# Patient Record
Sex: Male | Born: 1995 | Race: Black or African American | Hispanic: No | Marital: Single | Smoking: Never smoker
Health system: Southern US, Community
[De-identification: ages and names within clinical notes are randomized; demographics above are authoritative.]

## PROBLEM LIST (undated history)

## (undated) DIAGNOSIS — I456 Pre-excitation syndrome: Secondary | ICD-10-CM

---

## 2017-03-03 ENCOUNTER — Emergency Department (HOSPITAL_COMMUNITY)
Admission: EM | Admit: 2017-03-03 | Discharge: 2017-03-03 | Disposition: A | Discharge status: Home / Self Care | Payer: Managed Care, Other (non HMO) | Attending: Emergency Medicine | Admitting: Emergency Medicine

## 2017-03-03 ENCOUNTER — Encounter (HOSPITAL_COMMUNITY): Payer: Self-pay | Admitting: Emergency Medicine

## 2017-03-03 ENCOUNTER — Emergency Department (HOSPITAL_COMMUNITY): Payer: Managed Care, Other (non HMO)

## 2017-03-03 DIAGNOSIS — Z79899 Other long term (current) drug therapy: Secondary | ICD-10-CM | POA: Insufficient documentation

## 2017-03-03 DIAGNOSIS — R0789 Other chest pain: Secondary | ICD-10-CM | POA: Diagnosis not present

## 2017-03-03 DIAGNOSIS — R112 Nausea with vomiting, unspecified: Secondary | ICD-10-CM | POA: Diagnosis not present

## 2017-03-03 DIAGNOSIS — R002 Palpitations: Secondary | ICD-10-CM | POA: Insufficient documentation

## 2017-03-03 LAB — BASIC METABOLIC PANEL
Anion gap: 8 (ref 5–15)
BUN: 11 mg/dL (ref 6–20)
CO2: 25 mmol/L (ref 22–32)
CREATININE: 1.15 mg/dL (ref 0.61–1.24)
Calcium: 9.4 mg/dL (ref 8.9–10.3)
Chloride: 104 mmol/L (ref 101–111)
GFR calc Af Amer: >60 mL/min (ref >60)
GLUCOSE: 94 mg/dL (ref 65–99)
POTASSIUM: 4.4 mmol/L (ref 3.5–5.1)
Sodium: 137 mmol/L (ref 135–145)

## 2017-03-03 LAB — CBC
HEMATOCRIT: 43.9 % (ref 39.0–52.0)
Hemoglobin: 14.8 g/dL (ref 13.0–17.0)
MCH: 29.4 pg (ref 26.0–34.0)
MCHC: 33.7 g/dL (ref 30.0–36.0)
MCV: 87.3 fL (ref 78.0–100.0)
Platelets: 199 10*3/uL (ref 150–400)
RBC: 5.03 MIL/uL (ref 4.22–5.81)
RDW: 11.8 % (ref 11.5–15.5)
WBC: 4.9 10*3/uL (ref 4.0–10.5)

## 2017-03-03 LAB — I-STAT TROPONIN, ED: Troponin i, poc: 0 ng/mL (ref 0.00–0.08)

## 2017-03-03 MED ORDER — ACETAMINOPHEN 500 MG PO TABS
1000.0000 mg | ORAL_TABLET | Freq: Once | ORAL | Status: AC
  Administered 2017-03-03: 1000 mg via ORAL
  Filled 2017-03-03: qty 2

## 2017-03-03 MED ORDER — ONDANSETRON 4 MG PO TBDP
4.0000 mg | ORAL_TABLET | Freq: Once | ORAL | Status: AC
  Administered 2017-03-03: 4 mg via ORAL
  Filled 2017-03-03: qty 1

## 2017-03-03 NOTE — ED Provider Notes (Signed)
[facility]   CSN: [id number] Arrival date & time: 03/03/17  1026     History   Chief Complaint Chief Complaint  Patient presents with  . Chest Pain    HPI William Ortiz is a 21 y.o. male with past medical history of Parkinson White, presenting to the ED with an episode of palpitations this morning at 0600 that lasted about 5 minutes, with associated left-sided sharp chest pain.  His pains are followed by episode of nausea and vomiting.  He still feels some minor chest discomfort and feels tired.  Patient states he has had episodes in the past due to his Wolff-Parkinson-White, they consist of about a minute of palpitations that feel like tachycardia, which then resolve with vagal maneuvers.  This episode is different in the fact that it lasted a few minutes, and he has not had the chest pains before.  Patient followed by pediatric cardiology of [facility].  Current patient management of Wolff-Parkinson-White.  States he gets biyearly checkups, however does not have an cardiologist yet.   The history is provided by the patient.    History reviewed. No pertinent past medical history.  There are no active problems to display for this patient.   History reviewed. No pertinent surgical history.     Home Medications    Prior to Admission medications   Medication Sig Start Date End Date Taking? Authorizing Provider  escitalopram (LEXAPRO) 10 MG tablet Take 10 mg by mouth daily as needed. 12/02/15  Yes [provider]  Polyethyl Glycol-Propyl Glycol (SYSTANE OP) Apply 2 drops to eye as needed (dry eyes).   Yes [provider]    Family History No family history on file.  Social History Social History   Tobacco Use  . Smoking status: Never Smoker  . Smokeless tobacco: Never Used  Substance Use Topics  . Alcohol use: Yes  . Drug use: No     Allergies   Patient has no known allergies.   Review  of Systems Review of Systems  Constitutional: Positive for fatigue. Negative for fever.  Respiratory: Negative for shortness of breath.   Cardiovascular: Positive for chest pain and palpitations. Negative for leg swelling.  Gastrointestinal: Positive for nausea and vomiting.  Neurological: Negative for syncope.  All other systems reviewed and are negative.    Physical Exam Updated Vital Signs BP 127/80 (BP Location: Right Arm)   Pulse 72   Temp 97.9 F (36.6 C) (Oral)   Resp 15   Ht 6' (1.829 m)   Wt 108.9 kg (240 lb)   SpO2 99%   BMI 32.55 kg/m   Physical Exam  Constitutional: He appears well-developed and well-nourished. He does not appear ill. No distress.  HENT:  Head: Normocephalic and atraumatic.  Mouth/Throat: Oropharynx is clear and moist.  Eyes: Conjunctivae are normal.  Neck: Normal range of motion. Neck supple.  Cardiovascular: Normal rate, regular rhythm, normal heart sounds, intact distal pulses and normal pulses. Exam reveals no gallop and no friction rub.  No murmur heard. Pulmonary/Chest: Effort normal and breath sounds normal. No stridor. No respiratory distress. He has no wheezes. He has no rales. He exhibits tenderness.  Abdominal: Soft. Bowel sounds are normal. He exhibits no distension. There is no tenderness.  Neurological: He is alert.  Skin: Skin is warm.  Psychiatric: He has a normal mood and affect. His behavior is normal.  Nursing note and vitals reviewed.    ED Treatments / Results  Labs (  all labs ordered are listed, but only abnormal results are displayed) Labs Reviewed  BASIC METABOLIC PANEL  CBC  I-STAT TROPONIN, ED    EKG  EKG Interpretation  Date/Time:  Thursday March 03 2017 10:34:18 EST Ventricular Rate:  74 PR Interval:    QRS Duration: 104 QT Interval:  382 QTC Calculation: 424 R Axis:   38 Text Interpretation:  Ectopic atrial rhythm Consider right atrial enlargement Nonspecific ST depression No old tracing to  compare Confirmed by Melene PlanFloyd, Dan [phone]([id number]) on 03/03/2017 11:22:50 AM       Radiology Dg Chest 2 View  Result Date: 03/03/2017 CLINICAL DATA:  New onset of mid chest pain and 6 this morning associated with tachycardia and nausea and vomiting. Patient has an unspecified cardiac condition and is followed by cardiology is. EXAM: CHEST  2 VIEW COMPARISON:  None in PACs FINDINGS: The lungs are well-expanded and clear. The heart is top-normal in size. The pulmonary vascularity is normal. The mediastinum is normal in width. There is no pleural effusion. The bony thorax exhibits no acute abnormality. IMPRESSION: There is no active cardiopulmonary disease. Borderline cardiomegaly. Electronically Signed   By: David  Swazil[place]Jordan M.D.   On: 03/03/2017 11:11    Procedures Procedures (including critical care time)  Medications Ordered in ED Medications  acetaminophen (TYLENOL) tablet 1,000 mg (1,000 mg Oral Given 03/03/17 1343)  ondansetron (ZOFRAN-ODT) disintegrating tablet 4 mg (4 mg Oral Given 03/03/17 1343)     Initial Impression / Assessment and Plan / ED Course  I have reviewed the triage vital signs and the nursing notes.  Pertinent labs & imaging results that were available during my care of the patient were reviewed by me and considered in my medical decision making (see chart for details).     Pt with reproducible chest pain, and episode of palpitations that occurred at 0600 this morning. Pt with similar episodes in the past due to WPW, presenting today because he typically does not get chest pains with episodes. Exam reassuring. Trop neg. CXR neg. EKG with nonspecific changes. Chest pain persistently reproducible on exam. No SOB, no tachycardia or distress in ED. Pt followed by pediatric cardiology at [facility]. Recommend pt establish care with adult cardiologist in town. Strict return precautions discussed. Safe for discharge.  Patient discussed with and seen by Dr. Adriana Simasook, who guided treatment and  agrees with discharge.  Discussed results, findings, treatment and follow up. Patient advised of return precautions. Patient verbalized understanding and agreed with plan.  Final Clinical Impressions(s) / ED Diagnoses   Final diagnoses:  Chest wall pain    ED Discharge Orders    None       Shelly Spenser, [place] N, PA-C 03/03/17 1418    Donnetta Hutchingook, Brian, MD 03/04/17 1153

## 2017-03-03 NOTE — ED Triage Notes (Signed)
Patient reports that he having chest pains at his heart that started this morning when his HR was 200 for several minutes. Patient reports that he then got nauseated and vomited.

## 2017-03-03 NOTE — Discharge Instructions (Signed)
Please read instructions below.  You can take tylenol every 4 hours as needed for pain. Establish care with an adult cardiologist. You can use the referral provided for Dr. Graciela HusbandsKlein, or you can see anyone in that clinic. Return to the ER for new or worsening symptoms; including worsening chest pain, shortness of breath, pain that radiates to the arm or neck, pain or shortness of breath worsened with exertion.

## 2017-05-20 ENCOUNTER — Emergency Department (HOSPITAL_COMMUNITY): Payer: Managed Care, Other (non HMO)

## 2017-05-20 ENCOUNTER — Emergency Department (HOSPITAL_COMMUNITY)
Admission: EM | Admit: 2017-05-20 | Discharge: 2017-05-20 | Disposition: A | Discharge status: Home / Self Care | Payer: Managed Care, Other (non HMO) | Attending: Emergency Medicine | Admitting: Emergency Medicine

## 2017-05-20 ENCOUNTER — Encounter (HOSPITAL_COMMUNITY): Payer: Self-pay | Admitting: Emergency Medicine

## 2017-05-20 DIAGNOSIS — R079 Chest pain, unspecified: Secondary | ICD-10-CM | POA: Diagnosis present

## 2017-05-20 DIAGNOSIS — Z8679 Personal history of other diseases of the circulatory system: Secondary | ICD-10-CM

## 2017-05-20 DIAGNOSIS — R0789 Other chest pain: Secondary | ICD-10-CM | POA: Insufficient documentation

## 2017-05-20 DIAGNOSIS — Z79899 Other long term (current) drug therapy: Secondary | ICD-10-CM | POA: Diagnosis not present

## 2017-05-20 DIAGNOSIS — R002 Palpitations: Secondary | ICD-10-CM | POA: Diagnosis not present

## 2017-05-20 HISTORY — DX: Pre-excitation syndrome: I45.6

## 2017-05-20 LAB — BASIC METABOLIC PANEL
ANION GAP: 8 (ref 5–15)
BUN: 13 mg/dL (ref 6–20)
CHLORIDE: 106 mmol/L (ref 101–111)
CO2: 24 mmol/L (ref 22–32)
Calcium: 9.1 mg/dL (ref 8.9–10.3)
Creatinine, Ser: 1.05 mg/dL (ref 0.61–1.24)
GFR calc Af Amer: >60 mL/min (ref >60)
GFR calc non Af Amer: >60 mL/min (ref >60)
Glucose, Bld: 98 mg/dL (ref 65–99)
POTASSIUM: 4.2 mmol/L (ref 3.5–5.1)
SODIUM: 138 mmol/L (ref 135–145)

## 2017-05-20 LAB — I-STAT TROPONIN, ED: Troponin i, poc: 0 ng/mL (ref 0.00–0.08)

## 2017-05-20 LAB — CBC
HEMATOCRIT: 43.2 % (ref 39.0–52.0)
HEMOGLOBIN: 14.6 g/dL (ref 13.0–17.0)
MCH: 29.6 pg (ref 26.0–34.0)
MCHC: 33.8 g/dL (ref 30.0–36.0)
MCV: 87.4 fL (ref 78.0–100.0)
Platelets: 218 10*3/uL (ref 150–400)
RBC: 4.94 MIL/uL (ref 4.22–5.81)
RDW: 11.6 % (ref 11.5–15.5)
WBC: 4.8 10*3/uL (ref 4.0–10.5)

## 2017-05-20 MED ORDER — IBUPROFEN 600 MG PO TABS: 600.0000 mg | ORAL_TABLET | Freq: Four times a day (QID) | ORAL | 0 refills | Status: DC | PRN

## 2017-05-20 NOTE — ED Provider Notes (Signed)
[facility] Provider Note   CSN: [id number] Arrival date & time: 05/20/17  9147     History   Chief Complaint Chief Complaint  Patient presents with  . Chest Pain    HPI William Ortiz is a 22 y.o. male.  Patient presents with complaint of chest pain that started yesterday morning. He describes constant discomfort that is worse with certain movements. No fever. He had a cold last week that is improved/resolved now. In the afternoon, he reports palpitations that lasted about 15 minutes. He has a history of WPW and reports 1-2 minutes of palpitations every other week, no recurrent episodes that last 15 minutes.    The history is provided by the patient. No language interpreter was used.    Past Medical History:  Diagnosis Date  . Wolff-Parkinson-White (WPW) syndrome     There are no active problems to display for this patient.   History reviewed. No pertinent surgical history.     Home Medications    Prior to Admission medications   Medication Sig Start Date End Date Taking? Authorizing Provider  escitalopram (LEXAPRO) 10 MG tablet Take 10 mg by mouth daily as needed. 12/02/15   [provider]  Polyethyl Glycol-Propyl Glycol (SYSTANE OP) Apply 2 drops to eye as needed (dry eyes).    [provider]    Family History No family history on file.  Social History Social History   Tobacco Use  . Smoking status: Never Smoker  . Smokeless tobacco: Never Used  Substance Use Topics  . Alcohol use: Yes  . Drug use: No     Allergies   Patient has no known allergies.   Review of Systems Review of Systems  Constitutional: Negative for chills and fever.  HENT: Negative.   Respiratory: Positive for shortness of breath.   Cardiovascular: Positive for chest pain.  Gastrointestinal: Negative.  Negative for vomiting.  Musculoskeletal: Negative.   Skin: Negative.   Neurological: Negative.       Physical Exam Updated Vital Signs BP (!) 151/92 (BP Location: Right Arm)   Pulse 62   Temp (!) 97.4 F (36.3 C) (Oral)   Resp 18   SpO2 100%   Physical Exam  Constitutional: He appears well-developed and well-nourished.  HENT:  Head: Normocephalic.  Neck: Normal range of motion. Neck supple.  Cardiovascular: Normal rate and regular rhythm.    No systolic murmur is present. Pulmonary/Chest: Effort normal and breath sounds normal.  Chest wall tenderness on left parasternal chest.   Abdominal: Soft. Bowel sounds are normal. There is no tenderness. There is no rebound and no guarding.  Musculoskeletal: Normal range of motion.  Neurological: He is alert. No cranial nerve deficit.  Skin: Skin is warm and dry.  Psychiatric: He has a normal mood and affect.  Nursing note and vitals reviewed.    ED Treatments / Results  Labs (all labs ordered are listed, but only abnormal results are displayed) Labs Reviewed  CBC  BASIC METABOLIC PANEL  I-STAT TROPONIN, ED   Results for orders placed or performed during the hospital encounter of 05/20/17  Basic metabolic panel  Result Value Ref Range   Sodium 138 135 - 145 mmol/L   Potassium 4.2 3.5 - 5.1 mmol/L   Chloride 106 101 - 111 mmol/L   CO2 24 22 - 32 mmol/L   Glucose, Bld 98 65 - 99 mg/dL   BUN 13 6 - 20 mg/dL   Creatinine, Ser 8.29 0.61 - 1.24  mg/dL   Calcium 9.1 8.9 - 40.910.3 mg/dL   GFR calc non Af Amer >60 >60 mL/min   GFR calc Af Amer >60 >60 mL/min   Anion gap 8 5 - 15  CBC  Result Value Ref Range   WBC 4.8 4.0 - 10.5 K/uL   RBC 4.94 4.22 - 5.81 MIL/uL   Hemoglobin 14.6 13.0 - 17.0 g/dL   HCT 81.143.2 91.439.0 - 78.252.0 %   MCV 87.4 78.0 - 100.0 fL   MCH 29.6 26.0 - 34.0 pg   MCHC 33.8 30.0 - 36.0 g/dL   RDW 95.611.6 21.311.5 - 08.615.5 %   Platelets 218 150 - 400 K/uL  I-stat troponin, ED  Result Value Ref Range   Troponin i, poc 0.00 0.00 - 0.08 ng/mL   Comment 3            EKG  EKG Interpretation  Date/Time:  Friday May 20 2017 03:44:30 EST Ventricular Rate:  65 PR Interval:    QRS Duration: 111 QT Interval:  411 QTC Calculation: 428 R Axis:   41 Text Interpretation:  Sinus or ectopic atrial rhythm Multiple premature complexes, vent & supraven Ventricular preexcitation(WPW) No significant change since last tracing 03 Mar 2017 Confirmed by Devoria AlbeKnapp, Iva ([id number]) on 05/20/2017 3:53:20 AM       Radiology Dg Chest 2 View  Result Date: 05/20/2017 CLINICAL DATA:  22 year old male with chest pain and tachycardia. EXAM: CHEST  2 VIEW COMPARISON:  Chest radiograph dated 03/03/2017 FINDINGS: The heart size and mediastinal contours are within normal limits. Both lungs are clear. The visualized skeletal structures are unremarkable. IMPRESSION: No active cardiopulmonary disease. Electronically Signed   By: Elgie CollardArash  Radparvar M.D.   On: 05/20/2017 04:31    Procedures Procedures (including critical care time)  Medications Ordered in ED Medications - No data to display   Initial Impression / Assessment and Plan / ED Course  I have reviewed the triage vital signs and the nursing notes.  Pertinent labs & imaging results that were available during my care of the patient were reviewed by me and considered in my medical decision making (see chart for details).     Patient with history of WPW presents with chest discomfort and prolonged episode of palpitations lasting longer than his familiar episodes. No further or recurrent palpitations but he continues to have chest discomfort.  Chest wall is tender to palpation. VSS. WKG unchanged from previous. Labs and CXR unremarkable.   Will give ibuprofen. [facility] this [facility]. Encouraged to return under return precautions.   Final Clinical Impressions(s) / ED Diagnoses   Final diagnoses:  None   1. Heart palpitations 2. Chest wall pain 3. History of WPW  ED Discharge Orders    None       Elpidio AnisUpstill, Zuleica Seith,  Cordelia Poche-C 05/20/17 [id number]    Devoria AlbeKnapp, Iva, MD 05/20/17 [phone]

## 2017-05-20 NOTE — ED Triage Notes (Signed)
Pt from home with c/o CP that began at 0800. Pt states he feels his heart race and is unable to get his HR down. Pt is not tachycardic at time of assessment. Pt has hx of Wolf parkinson white and sees a cardiologist yearly. Pt reports central nonmoving cp that he rates 4/10

## 2017-05-20 NOTE — Discharge Instructions (Signed)
Keep your scheduled [facility]. If you have recurrent symptoms of prolonged palpitations, worsening chest pain or new symptoms of concern, return here or go [facility] for further emergent evaluation.

## 2017-05-20 NOTE — ED Notes (Signed)
Patient transported to X-ray 

## 2018-07-14 IMAGING — CR DG CHEST 2V
2 series · 2 of 2 positions shown · non-contrast
Comparison: None in PACs

CLINICAL DATA: New onset of mid chest pain and 6 this morning
associated with tachycardia and nausea and vomiting. Patient has an
unspecified cardiac condition and is followed by cardiology is.

EXAM:
CHEST  2 VIEW

[w chest pa]
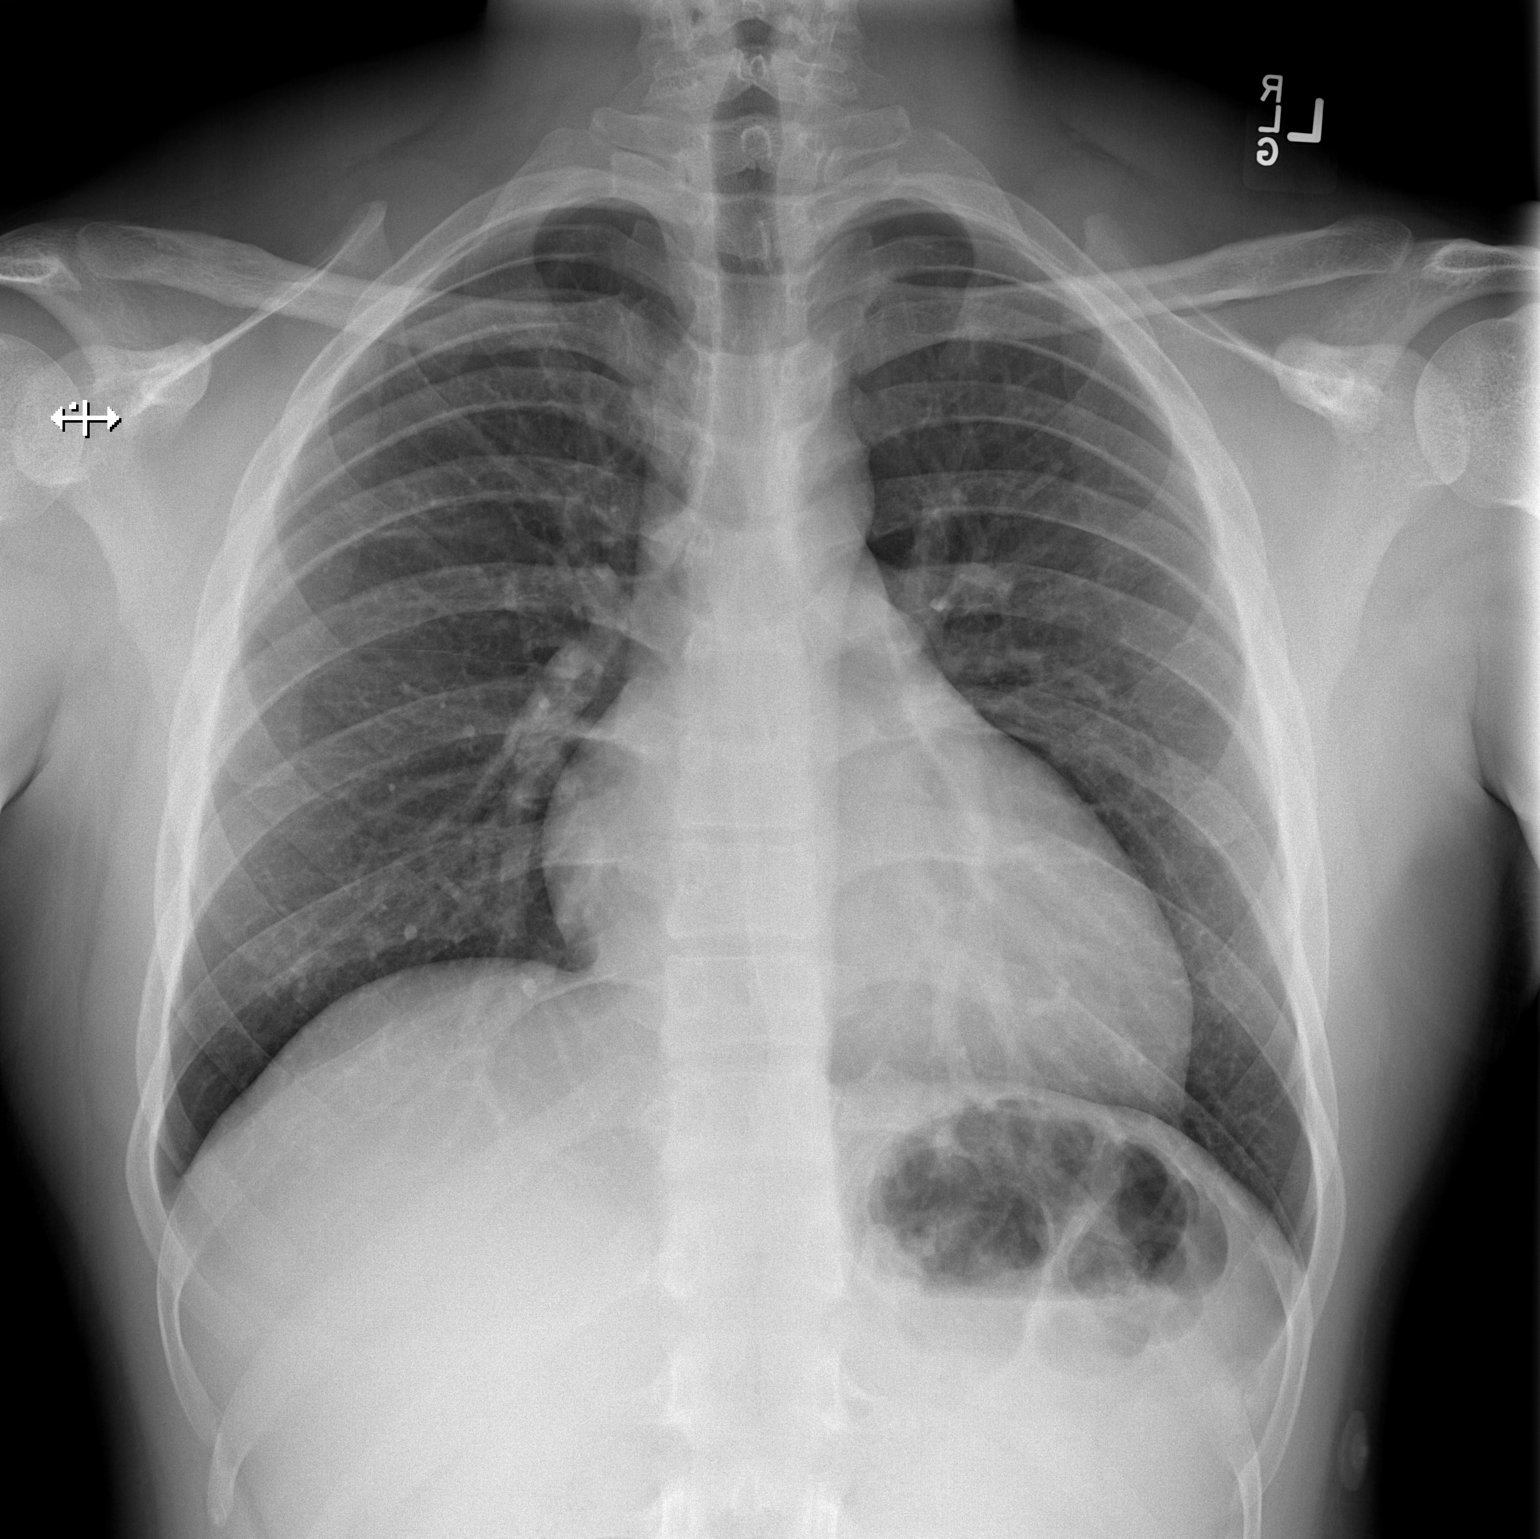

[w chest lat]
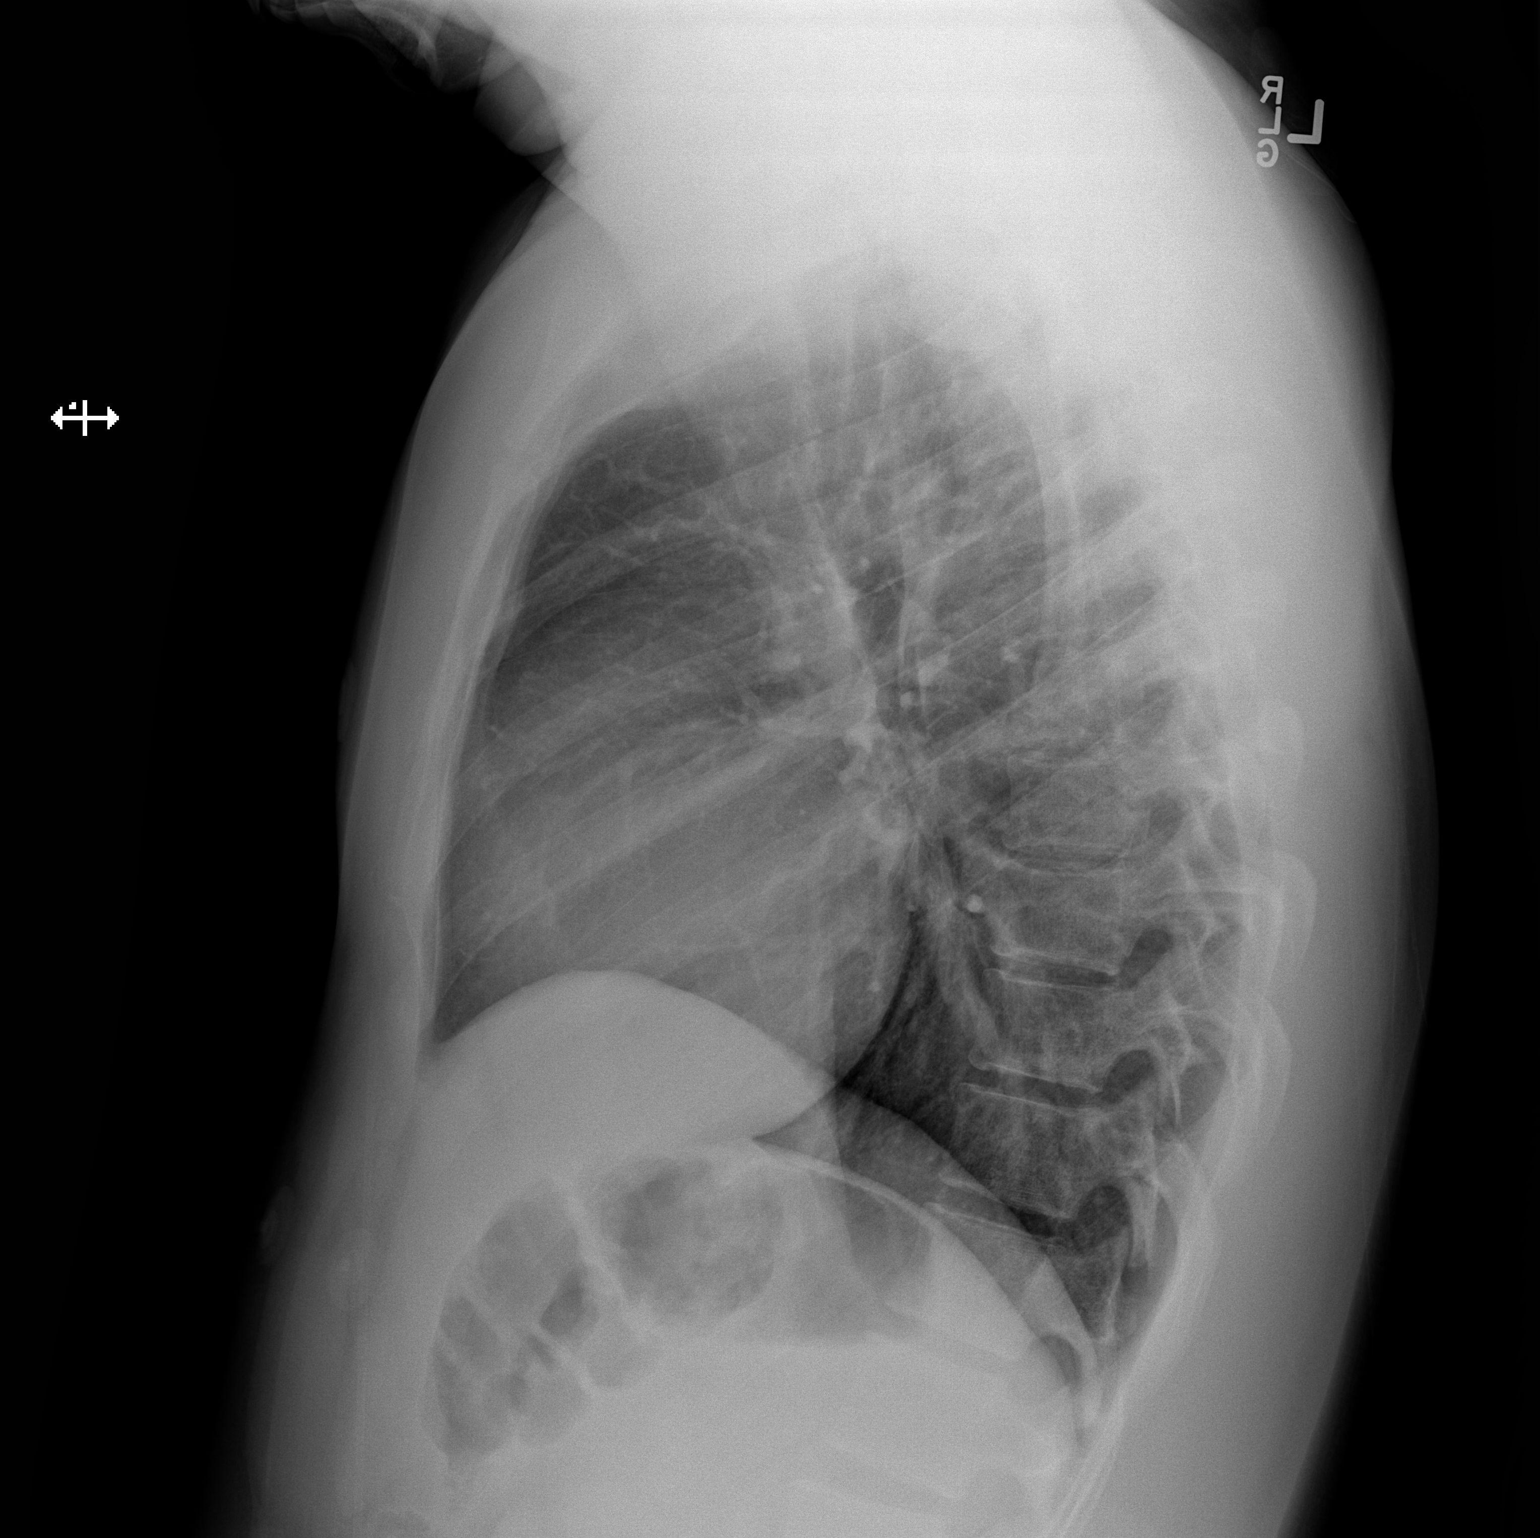

[2 of 2 positions shown; findings below may reference images not displayed]

FINDINGS: The lungs are well-expanded and clear. The heart is top-normal in
size. The pulmonary vascularity is normal. The mediastinum is normal
in width. There is no pleural effusion. The bony thorax exhibits no
acute abnormality.
IMPRESSION: There is no active cardiopulmonary disease. Borderline cardiomegaly.

## 2018-09-30 IMAGING — CR DG CHEST 2V
2 series · 2 of 2 positions shown · non-contrast
Comparison: Chest radiograph dated 03/03/2017

CLINICAL DATA: 21-year-old male with chest pain and tachycardia.

EXAM:
CHEST  2 VIEW

[w chest pa]
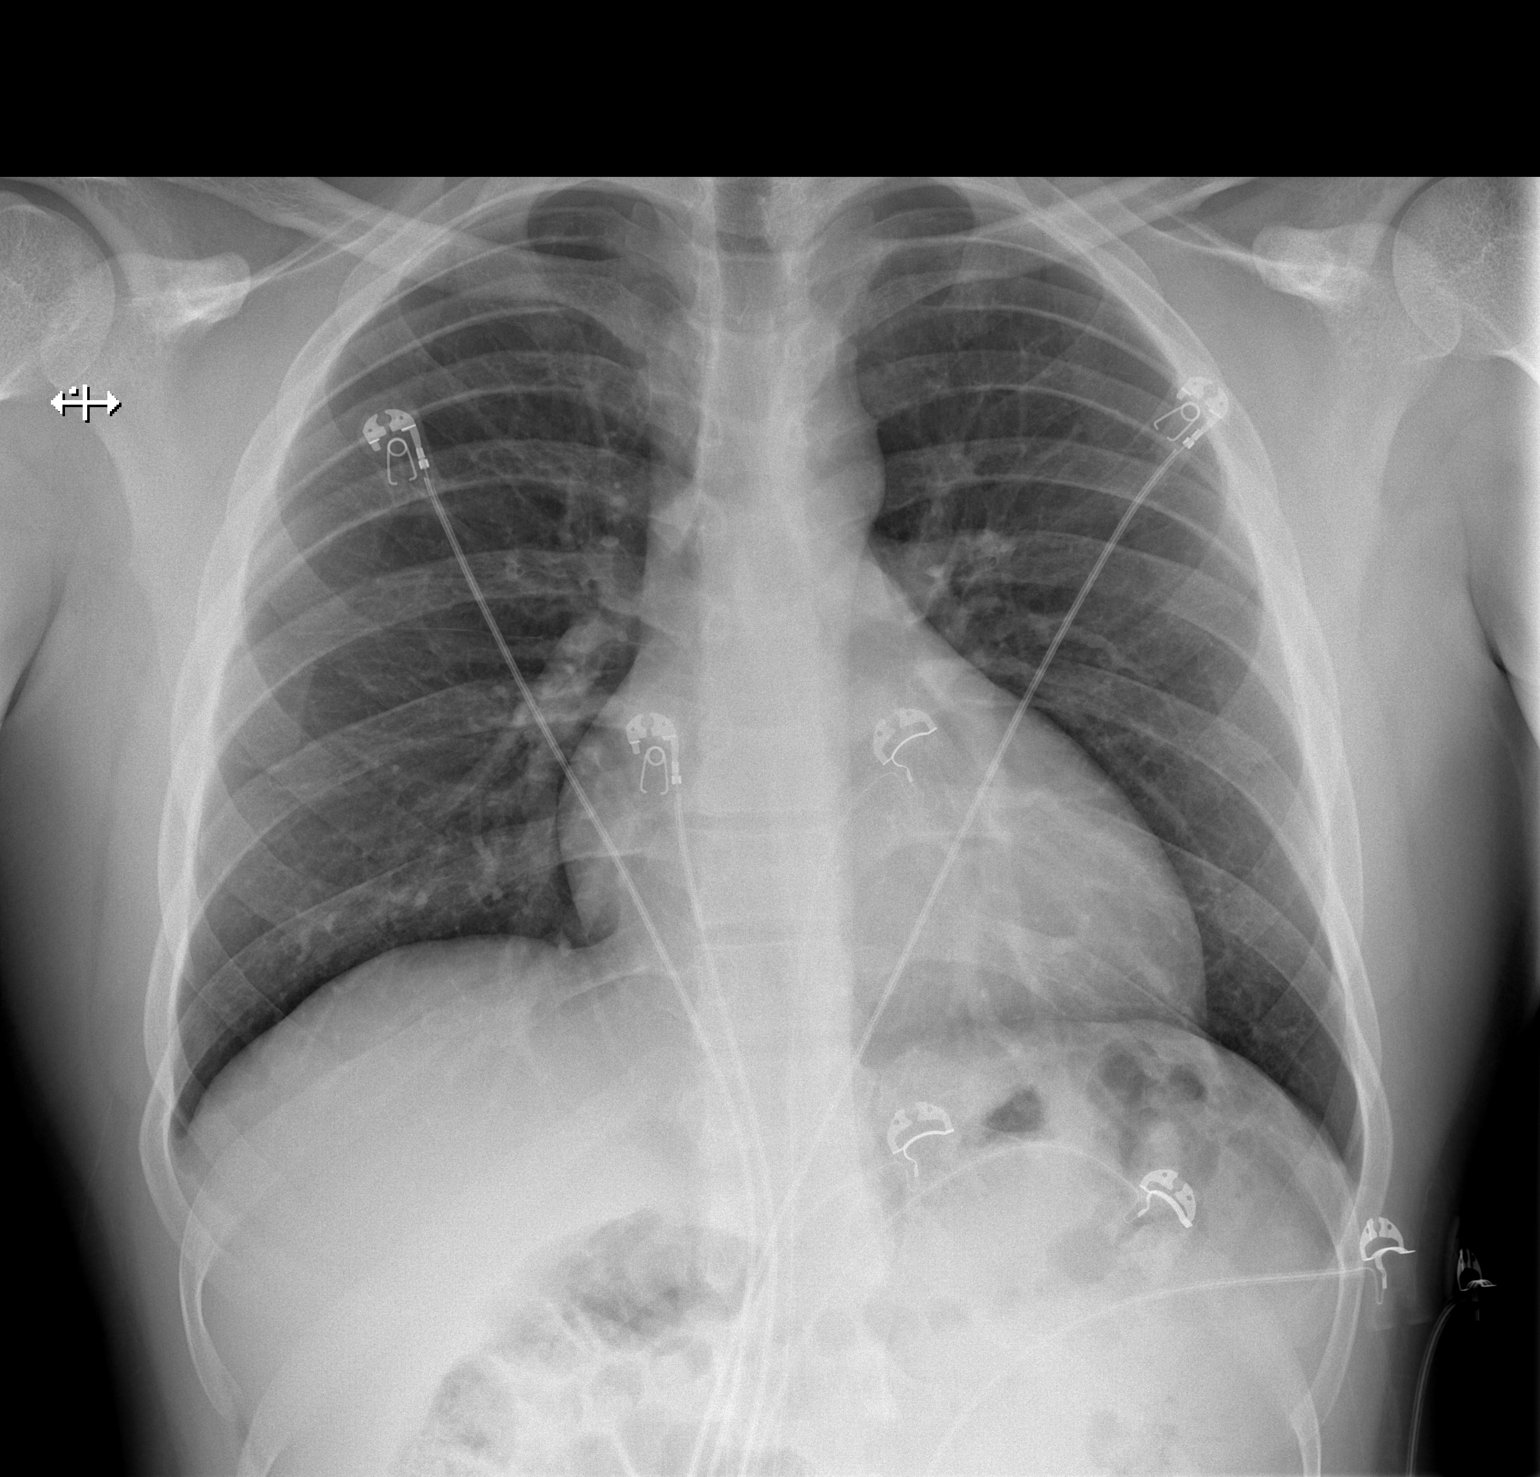

[w chest lat]
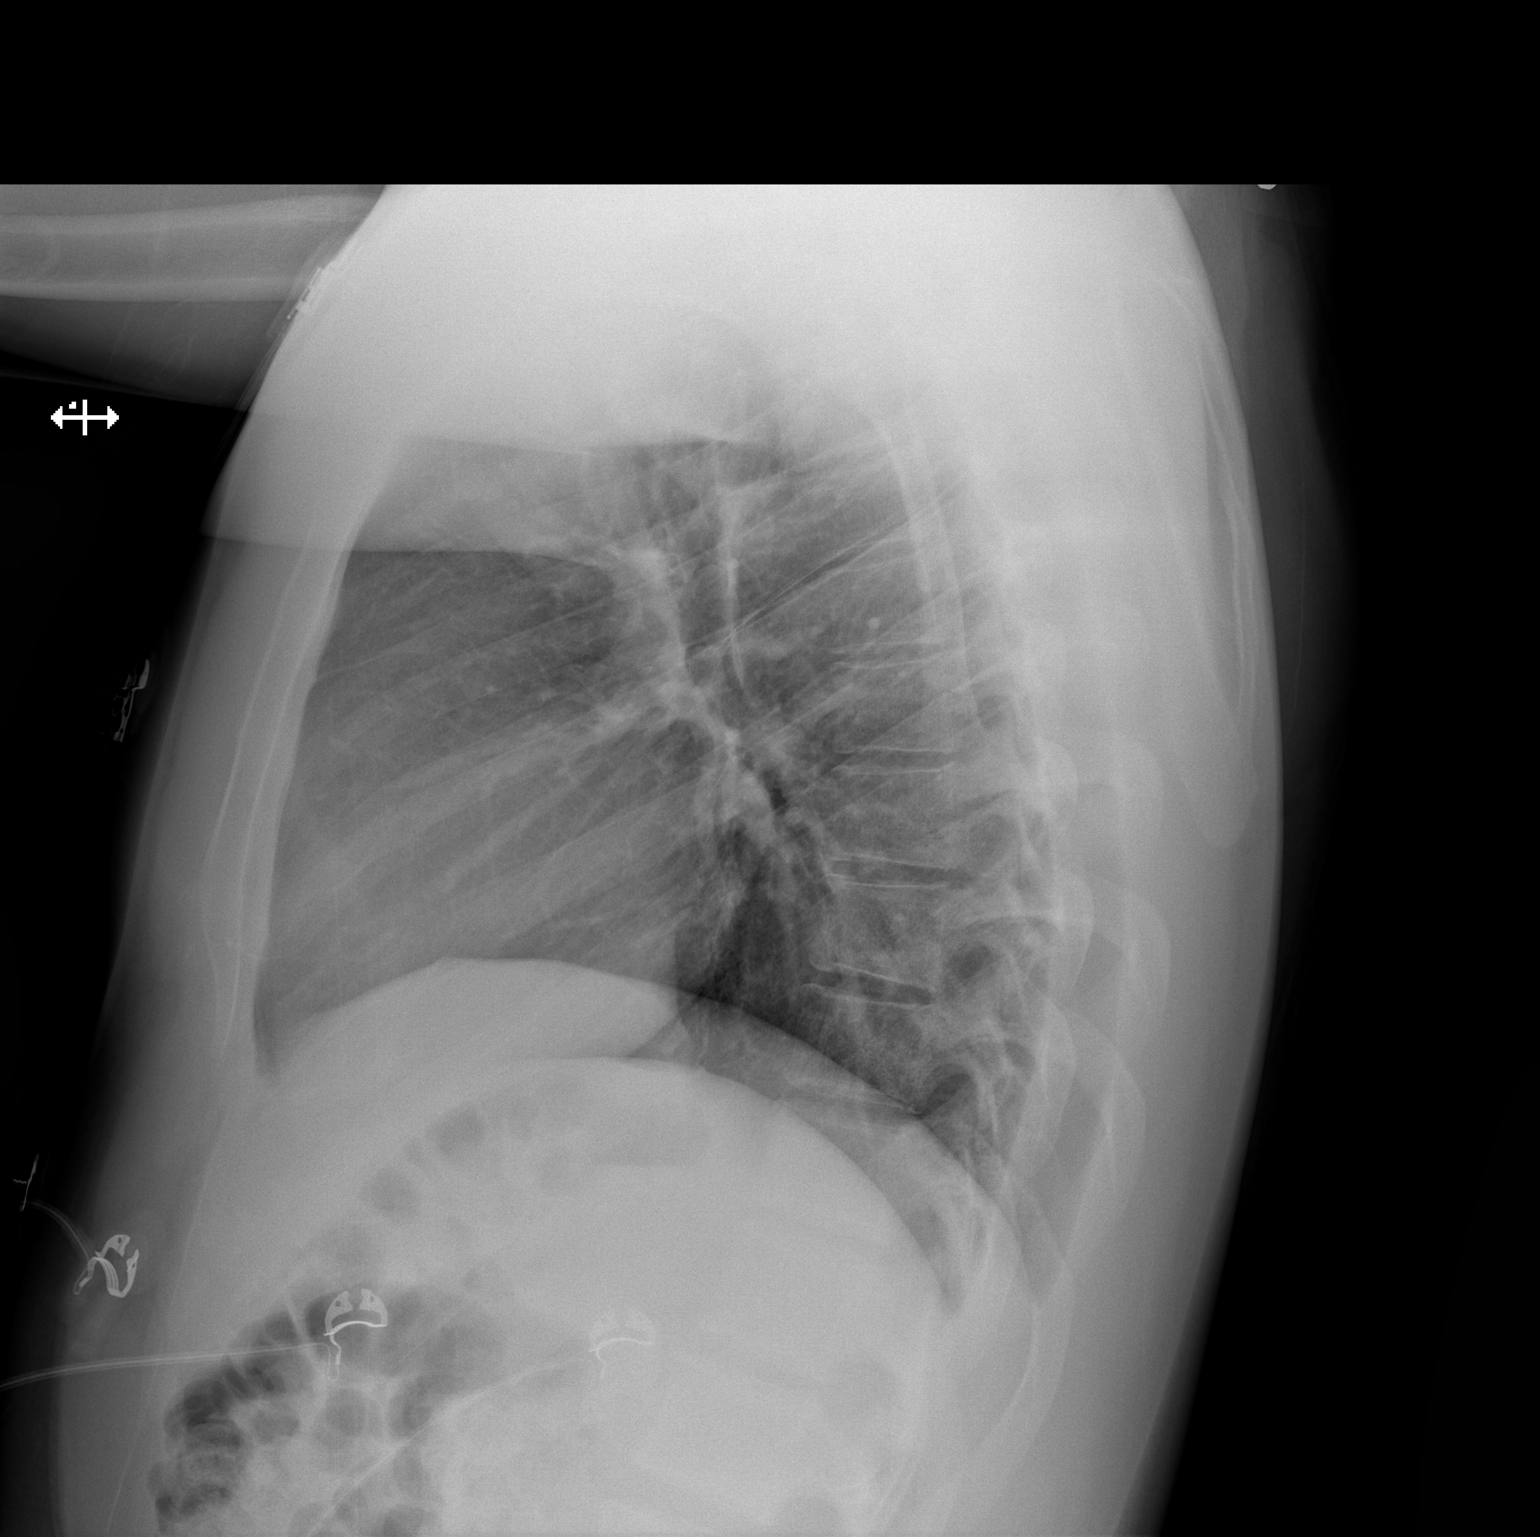

[2 of 2 positions shown; findings below may reference images not displayed]

FINDINGS: The heart size and mediastinal contours are within normal limits.
Both lungs are clear. The visualized skeletal structures are
unremarkable.
IMPRESSION: No active cardiopulmonary disease.

## 2019-09-29 ENCOUNTER — Encounter (HOSPITAL_COMMUNITY): Payer: Self-pay | Admitting: Psychiatry

## 2019-09-29 ENCOUNTER — Other Ambulatory Visit: Payer: Self-pay

## 2019-09-29 ENCOUNTER — Inpatient Hospital Stay (HOSPITAL_COMMUNITY)
Admission: AD | Admit: 2019-09-29 | Discharge: 2019-10-02 | DRG: 885 | Disposition: A | Discharge status: Home / Self Care | Payer: 59 | Attending: Psychiatry | Admitting: Psychiatry

## 2019-09-29 DIAGNOSIS — G4700 Insomnia, unspecified: Secondary | ICD-10-CM | POA: Diagnosis present

## 2019-09-29 DIAGNOSIS — Z20822 Contact with and (suspected) exposure to covid-19: Secondary | ICD-10-CM | POA: Diagnosis present

## 2019-09-29 DIAGNOSIS — R45851 Suicidal ideations: Secondary | ICD-10-CM | POA: Diagnosis present

## 2019-09-29 DIAGNOSIS — F332 Major depressive disorder, recurrent severe without psychotic features: Secondary | ICD-10-CM | POA: Diagnosis present

## 2019-09-29 LAB — SARS CORONAVIRUS 2 BY RT PCR (HOSPITAL ORDER, PERFORMED IN ~~LOC~~ HOSPITAL LAB): SARS Coronavirus 2: NEGATIVE

## 2019-09-29 MED ORDER — TRAZODONE HCL 50 MG PO TABS
50.0000 mg | ORAL_TABLET | Freq: Every evening | ORAL | Status: DC | PRN
  Filled 2019-09-29: qty 1

## 2019-09-29 MED ORDER — MAGNESIUM HYDROXIDE 400 MG/5ML PO SUSP: 30.0000 mL | Freq: Every day | ORAL | Status: DC | PRN

## 2019-09-29 MED ORDER — ALUM & MAG HYDROXIDE-SIMETH 200-200-20 MG/5ML PO SUSP: 30.0000 mL | ORAL | Status: DC | PRN

## 2019-09-29 MED ORDER — HYDROXYZINE HCL 25 MG PO TABS: 25.0000 mg | ORAL_TABLET | Freq: Four times a day (QID) | ORAL | Status: DC | PRN

## 2019-09-29 MED ORDER — ACETAMINOPHEN 325 MG PO TABS: 650.0000 mg | ORAL_TABLET | Freq: Four times a day (QID) | ORAL | Status: DC | PRN

## 2019-09-29 MED ORDER — ENSURE ENLIVE PO LIQD: 237.0000 mL | Freq: Two times a day (BID) | ORAL | Status: DC

## 2019-09-29 NOTE — Progress Notes (Signed)
   09/29/19 2156  Psych Admission Type (Psych Patients Only)  Admission Status Voluntary  Psychosocial Assessment  Patient Complaints None  Eye Contact Brief  Facial Expression Flat  Affect Appropriate to circumstance  Speech Logical/coherent  Interaction Minimal  Motor Activity Other (Comment) (WDL)  Appearance/Hygiene Unremarkable  Behavior Characteristics Appropriate to situation  Mood Anxious  Thought Process  Coherency WDL  Content WDL  Delusions None reported or observed  Perception WDL  Hallucination None reported or observed  Judgment WDL  Confusion None  Danger to Self  Current suicidal ideation? Denies  Danger to Others  Danger to Others None reported or observed
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
## 2019-09-29 NOTE — Progress Notes (Signed)
   09/29/19 2153  COVID-19 Daily Checkoff  Have you had a fever (temp > 37.80C/100F)  in the past 24 hours?  No  If you have had runny nose, nasal congestion, sneezing in the past 24 hours, has it worsened? No  COVID-19 EXPOSURE  Have you traveled outside the state in the past 14 days? No  Have you been in contact with someone with a confirmed diagnosis of COVID-19 or PUI in the past 14 days without wearing appropriate PPE? No  Have you been living in the same home as a person with confirmed diagnosis of COVID-19 or a PUI (household contact)? No  Have you been diagnosed with COVID-19? No
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
## 2019-09-30 DIAGNOSIS — F332 Major depressive disorder, recurrent severe without psychotic features: Principal | ICD-10-CM

## 2019-09-30 LAB — RAPID URINE DRUG SCREEN, HOSP PERFORMED
Amphetamines: NOT DETECTED
Barbiturates: NOT DETECTED
Benzodiazepines: NOT DETECTED
Cocaine: NOT DETECTED
Opiates: NOT DETECTED
Tetrahydrocannabinol: NOT DETECTED

## 2019-09-30 MED ORDER — ESCITALOPRAM OXALATE 5 MG PO TABS
5.0000 mg | ORAL_TABLET | Freq: Every day | ORAL | Status: DC
  Administered 2019-09-30 – 2019-10-01 (×2): 5 mg via ORAL
  Filled 2019-09-30 (×4): qty 1

## 2019-09-30 MED ORDER — LORAZEPAM 0.5 MG PO TABS: 0.5000 mg | ORAL_TABLET | Freq: Four times a day (QID) | ORAL | Status: DC | PRN
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
## 2019-09-30 NOTE — [facility] Group Notes (Signed)
LCSW Group Therapy 09/29/19 1:15 pm   Type of Therapy and Topic:  Group Therapy:  Setting Goals   Participation Level:  Active   Description of Group: In this process group, patients discussed using strengths to work toward goals and address challenges.  Patients identified two positive things about themselves and one goal they were working on.  Patients were given the opportunity to share openly and support each other's plan for self-empowerment.  The group discussed the value of gratitude and were encouraged to have a daily reflection of positive characteristics or circumstances.  Patients were encouraged to identify a plan to utilize their strengths to work on current challenges and goals.   Therapeutic Goals 1. Patient will verbalize personal strengths/positive qualities and relate how these can assist with achieving desired personal goals 2. Patients will verbalize affirmation of peers plans for personal change and goal setting 3. Patients will explore the value of gratitude and positive focus as related to successful achievement of goals 4. Patients will verbalize a plan for regular reinforcement of personal positive qualities and circumstances.   Summary of Patient Progress: Patient identified the definition of goals. Patients was given the opportunity to share openly and support other group members' plan for self-empowerment. Patient verbalized personal strength and how they relate to achieving the desired goal. Patient was able to identify positive goals to work towards when she returns home.        Therapeutic Modalities Cognitive Behavioral Therapy Motivational Interviewing

## 2019-09-30 NOTE — [facility] Group Notes (Signed)
Adult Psychoeducational Group Note  Date:  09/30/2019 Time:  12:35 PM  Group Topic/Focus:   Group Topic/Focus: PROGRESSIVE RELAXATION. A group where deep breathing is taught and tensing and relaxation muscle groups is used. Imagery is used as well.  Pts are asked to imagine 3 pillars that hold them up when they are not able to hold themselves up.    Participation Level:  Active  Participation Quality:  Appropriate  Affect:  Appropriate  Cognitive:  Appropriate  Insight: Good  Engagement in Group:  Engaged  Modes of Intervention:  Imagery.  Additional Comments:  Pt shared the three things that hold him up when he is having difficulty holding himself up is his family, friends and his photography.  Dione Housekeeper 09/30/2019, 12:35 PM

## 2019-09-30 NOTE — [facility] Suicide Risk Assessment (Signed)
[facility] INPATIENT:  Family/Significant Other Suicide Prevention Education  Suicide Prevention Education:  Patient Refusal for Family/Significant Other Suicide Prevention Education: The patient Densil Ottey has refused to provide written consent for family/significant other to be provided Family/Significant Other Suicide Prevention Education during admission and/or prior to discharge.  Physician notified.  Carloyn Jaeger Grossman-Orr 09/30/2019, 1:24 PM

## 2019-09-30 NOTE — Progress Notes (Signed)
   09/30/19 2328  Psych Admission Type (Psych Patients Only)  Admission Status Voluntary  Psychosocial Assessment  Patient Complaints None  Eye Contact Brief  Facial Expression Flat  Affect Appropriate to circumstance  Speech Logical/coherent  Interaction Minimal  Motor Activity Other (Comment) (WDL)  Appearance/Hygiene Unremarkable  Behavior Characteristics Appropriate to situation  Mood Pleasant  Thought Process  Coherency WDL  Content WDL  Delusions None reported or observed  Perception WDL  Hallucination None reported or observed  Judgment WDL  Confusion None  Danger to Self  Current suicidal ideation? Denies  Danger to Others  Danger to Others None reported or observed

## 2019-09-30 NOTE — Progress Notes (Signed)
   09/30/19 2325  COVID-19 Daily Checkoff  Have you had a fever (temp > 37.80C/100F)  in the past 24 hours?  No  If you have had runny nose, nasal congestion, sneezing in the past 24 hours, has it worsened? No  COVID-19 EXPOSURE  Have you traveled outside the state in the past 14 days? No  Have you been in contact with someone with a confirmed diagnosis of COVID-19 or PUI in the past 14 days without wearing appropriate PPE? No  Have you been living in the same home as a person with confirmed diagnosis of COVID-19 or a PUI (household contact)? No  Have you been diagnosed with COVID-19? No

## 2019-09-30 NOTE — H&P (Addendum)
Psychiatric Admission Assessment Adult  Patient Identification: William Ortiz MRN:  [id number] Date of Evaluation:  09/30/2019 Chief Complaint:  Major depressive disorder, recurrent severe without psychotic features ([place]) [F33.2] Principal Diagnosis: <principal problem not specified> Diagnosis:  Active Problems:   Major depressive disorder, recurrent severe without psychotic features ([place])  History of Present Illness: Patient is a 24 yo M, single, no children, self employed. He presented to the hospital voluntarily at the request of his friends because he was experiencing suicidal ideation with a plan to jump from balcony from last three days. He also reports he has been feeling depressed, socially isolating , spending " a lot of time just lying in bed, not going outside even". He reports feeling depressed, low energy, unable to sleep, poor appetite, feeling unmotivated and isolating himself from last 2 weeks.He reports he has been experiencing increased anxiety and depression. He describes he has been experiencing panic attacks and increased tendency to worry which has made daily functioning and focusing on tasks more difficult. He states " the anxiety makes me fall behind on work and not doing things that I said I was going to do and that in turn makes me even more anxious". Denies psychotic symptoms. Today patient feels better. On the scale of 1-10, 10 being severely depressed he reports his mood to be around 4. He reports no suicidal ideation today or any hallucinations. He works as a Programme researcher, broadcasting/film/video and feels really guilty about letting down everyone because he never finishes job on time. He explains how he starts some work like Loss adjuster, chartered 2000 worth of kayaking things but never went into water with them. He states he has good relationship with his family. He has his mother, 2 siblings and the stepfather. Birth father passed away 6 years ago. He bites his nails when he is anxious.  Patient reports ongoing suicidal ideation issues to include sporadic alcohol use stating he consumes "a couple beers once in a while" stating he last use was sometimes earlier in the week when he reports he "drank a beer." Patient also states he uses cannabis once or twice a week with last use also earlier this week when he reports he used less than a gram. Denies any manic/hypomaniuc episodes. Associated Signs/Symptoms: Depression Symptoms:  depressed mood, anhedonia, insomnia, fatigue, feelings of worthlessness/guilt, difficulty concentrating, hopelessness, suicidal thoughts with specific plan, anxiety, panic attacks, (Hypo) Manic Symptoms:  NA Anxiety Symptoms:  Excessive Worry, Psychotic Symptoms:  NA PTSD Symptoms: NA Total Time spent with patient: 30 minutes  Past Psychiatric History: No prior psychiatric admissions. His father dies 6 years ago and he met with his family doctor, talked to her about feeling depressed and was prescribed Lexapro which helped him and he tolerated it well. He stopped taking it after few months and had an episode of depression as a freshman in college at which time he reports he attempted to hang self , but roommate prevented . At the time did not seek treatment.He also states he developed anxiety, panic attacks while in college. Does not endorse agoraphobia. Does not endorse any clear history of mania or hypomania. Denies history of psychosis. Has not taken any psychiatric medications for several years .He reports he was attending individual psychotherapy up to a few months ago.   Is the patient at risk to self? Yes.    Has the patient been a risk to self in the past 6 months? Yes.    Has the patient been a risk to self  within the distant past? Yes.    Is the patient a risk to others? No.  Has the patient been a risk to others in the past 6 months? No.  Has the patient been a risk to others within the distant past? No.   Prior Inpatient Therapy:   Prior  Outpatient Therapy:    Alcohol Screening: 1. How often do you have a drink containing alcohol?: 2 to 4 times a month 2. How many drinks containing alcohol do you have on a typical day when you are drinking?: 1 or 2 3. How often do you have six or more drinks on one occasion?: Less than monthly AUDIT-C Score: 3 9. Have you or someone else been injured as a result of your drinking?: No 10. Has a relative or friend or a doctor or another health worker been concerned about your drinking or suggested you cut down?: No Alcohol Use Disorder Identification Test Final Score (AUDIT): 3 Alcohol Brief Interventions/Follow-up: Alcohol Education Substance Abuse History in the last 12 months:  Yes.   Consequences of Substance Abuse: NA Previous Psychotropic Medications: Yes  Psychological Evaluations: Yes  Past Medical History:  Past Medical History:  Diagnosis Date  . Wolff-Parkinson-White (WPW) syndrome    History reviewed. No pertinent surgical history. Family History: History reviewed. No pertinent family history. Family Psychiatric  History: NA Tobacco Screening: Have you used any form of tobacco in the last 30 days? (Cigarettes, Smokeless Tobacco, Cigars, and/or Pipes): No Social History:  Social History   Substance and Sexual Activity  Alcohol Use Yes     Social History   Substance and Sexual Activity  Drug Use No    Additional Social History:      Pain Medications: See MAR Prescriptions: See MAR Over the Counter: See MAR History of alcohol / drug use?: Yes Longest period of sobriety (when/how long): Unknown Negative Consequences of Use:  (NA) Withdrawal Symptoms:  (NA) Name of Substance 1: Cannabis 1 - Age of First Use: 18 1 - Amount (size/oz): Varies 1 - Frequency: Varies 1 - Duration: Ongoing 1 - Last Use / Amount: Pt states "about a week ago" unknown amount Name of Substance 2: Alcohol 2 - Age of First Use: 18 2 - Amount (size/oz): Varies 2 - Frequency: Varies 2 -  Duration: Ongoing 2 - Last Use / Amount: Pt states 'sometimes last week" pt states "a couple beers"                Allergies:  No Known Allergies Lab Results:  Results for orders placed or performed during the hospital encounter of 09/29/19 (from the past 48 hour(s))  SARS Coronavirus 2 by RT PCR (hospital order, performed in [facility] hospital lab) Nasopharyngeal Nasopharyngeal Swab     Status: None   Collection Time: 09/29/19  2:59 PM   Specimen: Nasopharyngeal Swab  Result Value Ref Range   SARS Coronavirus 2 NEGATIVE NEGATIVE    Comment: (NOTE) SARS-CoV-2 target nucleic acids are NOT DETECTED.  The SARS-CoV-2 RNA is generally detectable in upper and lower respiratory specimens during the acute phase of infection. The lowest concentration of SARS-CoV-2 viral copies this assay can detect is 250 copies / mL. A negative result does not preclude SARS-CoV-2 infection and should not be used as the sole basis for treatment or other patient management decisions.  A negative result may occur with improper specimen collection / handling, submission of specimen other than nasopharyngeal swab, presence of viral mutation(s) within the areas targeted by  this assay, and inadequate number of viral copies (<250 copies / mL). A negative result must be combined with clinical observations, patient history, and epidemiological information.  Fact Sheet for Patients:   StrictlyIdeas.no  Fact Sheet for Healthcare Providers: BankingDealers.co.za  This test is not yet approved or  cleared by the [place] FDA and has been authorized for detection and/or diagnosis of SARS-CoV-2 by FDA under an Emergency Use Authorization (EUA).  This EUA will remain in effect (meaning this test can be used) for the duration of the COVID-19 declaration under Section 564(b)(1) of the Act, 21 U.S.C. section 360bbb-3(b)(1), unless the authorization is terminated  or revoked sooner.  Performed at [facility], [place] [address], [place], [place] [address]     Blood Alcohol level:  No results found for: [facility]  Metabolic Disorder Labs:  No results found for: HGBA1C, MPG No results found for: PROLACTIN No results found for: CHOL, TRIG, HDL, CHOLHDL, VLDL, LDLCALC  Current Medications: Current Facility-Administered Medications  Medication Dose Route Frequency Provider Last Rate Last Admin  . acetaminophen (TYLENOL) tablet 650 mg  650 mg Oral Q6H PRN Patrecia Pour, NP      . alum & mag hydroxide-simeth (MAALOX/MYLANTA) 200-200-20 MG/5ML suspension 30 mL  30 mL Oral Q4H PRN Patrecia Pour, NP      . escitalopram (LEXAPRO) tablet 5 mg  5 mg Oral Daily Cobos, Myer Peer, MD   5 mg at 09/30/19 0817  . feeding supplement (ENSURE ENLIVE) (ENSURE ENLIVE) liquid 237 mL  237 mL Oral BID BM Cobos, Myer Peer, MD      . LORazepam (ATIVAN) tablet 0.5 mg  0.5 mg Oral Q6H PRN Cobos, Myer Peer, MD      . magnesium hydroxide (MILK OF MAGNESIA) suspension 30 mL  30 mL Oral Daily PRN Patrecia Pour, NP      . traZODone (DESYREL) tablet 50 mg  50 mg Oral QHS PRN Armando Reichert, MD       PTA Medications: Medications Prior to Admission  Medication Sig Dispense Refill Last Dose  . escitalopram (LEXAPRO) 10 MG tablet Take 10 mg by mouth daily as needed.     Marland Kitchen ibuprofen (ADVIL,MOTRIN) 600 MG tablet Take 1 tablet (600 mg total) by mouth every 6 (six) hours as needed. 30 tablet 0   . Polyethyl Glycol-Propyl Glycol (SYSTANE OP) Apply 2 drops to eye as needed (dry eyes).       Musculoskeletal: Strength & Muscle Tone: within normal limits Gait & Station: normal Patient leans: N/A  Psychiatric Specialty Exam: Physical Exam  Review of Systems  Blood pressure 121/72, pulse 69, temperature 98.5 F (36.9 C), temperature source Oral, resp. rate 18, height 6' (1.829 m), weight 111.1 kg.Body mass index is 33.23 kg/m.  General Appearance: Casual  Eye  Contact:  Good  Speech:  Normal Rate  Volume:  Normal  Mood:  Anxious  Affect:  Appropriate  Thought Process:  Coherent  Orientation:  Full (Time, Place, and Person)  Thought Content:  WDL  Suicidal Thoughts:  No  Homicidal Thoughts:  No  Memory:  Immediate;   Good  Judgement:  Good  Insight:  Good  Psychomotor Activity:  Normal  Concentration:  Concentration: Good  Recall:  Good  Fund of Knowledge:  Good  Language:  Good  Akathisia:  No  Handed:  Right  AIMS (if indicated):     Assets:  Communication Skills Desire for Improvement Social Support  ADL's:  Intact  Cognition:  WNL  Sleep:  Number of Hours: 6.75   Diagnosis:  1. Major Depressive disorder with no psychotic features. 2. Reported panic disorder 3. Trazodone 50 mg QHS PRN  Plan:   1. Start Lexapro 24m Po daily for mood disorder 2. Lorazepam 0.5 mg Q6H PRN for anxiety  Treatment Plan Summary: Daily contact with patient to assess and evaluate symptoms and progress in treatment  Observation Level/Precautions:  Continuous Observation  Laboratory:  NA  Psychotherapy:    Medications:    Consultations:    Discharge Concerns:    Estimated LOS:  Other:     Physician Treatment Plan for Primary Diagnosis: <principal problem not specified> Long Term Goal(s): Improvement in symptoms so as ready for discharge  Short Term Goals: Ability to maintain clinical measurements within normal limits will improve  Physician Treatment Plan for Secondary Diagnosis: Active Problems:   Major depressive disorder, recurrent severe without psychotic features ([place]  Long Term Goal(s): Improvement in symptoms so as ready for discharge  Short Term Goals: Ability to identify changes in lifestyle to reduce recurrence of condition will improve, Ability to verbalize feelings will improve, Ability to disclose and discuss suicidal ideas and Compliance with prescribed medications will improve  I certify that inpatient services furnished  can reasonably be expected to improve the patient's condition.    AHonor Junes MD 7/11/202111:26 AM   Have discussed with treatment team and Dr.Dagar. Agree with assessment 24, single, no children, self employed. Patient presented to hospital voluntarily at the request of his friends.  He reports he has been experiencing increased anxiety and depression. He describes he has been experiencing panic attacks and increased tendency to worry which has made daily functioning and focusing on tasks more difficult. He states " the anxiety makes me fall behind on work and not doing things that I said I was going to do and that in turn makes me even more anxious". He also reports he has been feeling depressed, socially isolating , spending " a lot of time just lying in bed, not going outside even". He reports that three days ago he had suicidal ideations of jumping off a balcony. Endorses neuro-vegetative symptoms- endorses anhedonia, poor sleep, low energy level , decreased sense of self worth,  decreased appetite. Denies psychotic symptoms. No prior psychiatric admissions, prior episode of depression as a freshman in college at which time he reports he attempted to hang self , but roommate prevented . At the time did not seek treatment. He also states he developed anxiety, panic attacks while in college. Does not endorse agoraphobia. Does not endorse any clear history of mania or hypomania. Denies history of psychosis. History of prior treatment with Lexapro which he took for about a year. He states he feels he responded well to this antidepressant. Has not taken any psychiatric medications for several years . He reports he was attending individual psychotherapy up to a few months ago.  Denies alcohol or drug abuse, reports he drinks socially about once a week , occasional cannabis use. Medical History - reports history of [employer]. NKDA.  He was not taking any medications prior to  admission. Father passed away several years ago from complications of cardiac disease, mother alive, has 4 siblings. No history of mental illness in family.No suicides in family.   Dx- MDD, no psychotic features. Consider Panic Disorder by history  Plan- Inpatient admission. We reviewed options- reports past history of good response to Lexapro. Start Lexapro at  5 mgrs QDAY initially Ativan 0.5 mgrs Q 6 hours PRN for anxiety as needed  Trazodone 50 mgrs QHS PRN insomnia  Routine labs ( CBC, CMP, UDS, TSH)

## 2019-09-30 NOTE — [facility] Suicide Risk Assessment (Signed)
[facility] Admission Suicide Risk Assessment   Nursing information obtained from:  Patient Demographic factors:  Male, Adolescent or young adult Current Mental Status:  Suicidal ideation indicated by patient Loss Factors:  NA Historical Factors:  Prior suicide attempts, Family history of mental illness or substance abuse, Impulsivity Risk Reduction Factors:  Sense of responsibility to family, Employed, Positive social support  Total Time spent with patient: 45 minutes Principal Problem: depression, anxiety Diagnosis:  Active Problems:   Major depressive disorder, recurrent severe without psychotic features ([facility])  Subjective Data:   Continued Clinical Symptoms:  Alcohol Use Disorder Identification Test Final Score (AUDIT): 3 The "Alcohol Use Disorders Identification Test", Guidelines for Use in Primary Care, Second Edition.  World Science writer [facility]). Score between 0-7:  no or low risk or alcohol related problems. Score between 8-15:  moderate risk of alcohol related problems. Score between 16-19:  high risk of alcohol related problems. Score 20 or above:  warrants further diagnostic evaluation for alcohol dependence and treatment.   CLINICAL FACTORS:  24, single, no children, self employed. Patient presented to hospital voluntarily at the request of his friends.  He reports he has been experiencing increased anxiety and depression. He describes he has been experiencing panic attacks and increased tendency to worry which has made daily functioning and focusing on tasks more difficult. He states " the anxiety makes me fall behind on work and not doing things that I said I was going to do and that in turn makes me even more anxious". He also reports he has been feeling depressed, socially isolating , spending " a lot of time just lying in bed, not going outside even". He reports that three days ago he had suicidal ideations of jumping off a balcony. Endorses neuro-vegetative symptoms-  endorses anhedonia, poor sleep, low energy level , decreased sense of self worth,  decreased appetite. Denies psychotic symptoms. No prior psychiatric admissions, prior episode of depression as a freshman in college at which time he reports he attempted to hang self , but roommate prevented . At the time did not seek treatment. He also states he developed anxiety, panic attacks while in college. Does not endorse agoraphobia. Does not endorse any clear history of mania or hypomania. Denies history of psychosis. History of prior treatment with Lexapro which he took for about a year. He states he feels he responded well to this antidepressant. Has not taken any psychiatric medications for several years . He reports he was attending individual psychotherapy up to a few months ago.  Denies alcohol or drug abuse, reports he drinks socially about once a week , occasional cannabis use. Medical History - reports history of [employer] . NKDA.  He was not taking any medications prior to admission. Father passed away several years ago from complications of cardiac disease, mother alive, has 4 siblings. No history of mental illness in family.No suicides in family.   Dx- MDD, no psychotic features. Consider Panic Disorder by history  Plan- Inpatient admission. We reviewed options- reports past history of good response to Lexapro. Start Lexapro at 5 mgrs QDAY initially Ativan 0.5 mgrs Q 6 hours PRN for anxiety as needed  Trazodone 50 mgrs QHS PRN insomnia  Routine labs ( CBC, CMP, UDS, TSH)       Musculoskeletal: Strength & Muscle Tone: within normal limits Gait & Station: normal Patient leans: N/A  Psychiatric Specialty Exam: Physical Exam  Review of Systems  No chest pain, no shortness of breath, no cough,  no fever, no chills   Blood pressure 121/72, pulse 69, temperature 98.5 F (36.9 C), temperature source Oral, resp. rate 18, height 6' (1.829 m), weight 111.1 kg.Body mass index is  33.23 kg/m.  General Appearance: Well Groomed  Eye Contact:  Good  Speech:  Normal Rate not pressured or loud   Volume:  Normal  Mood:  reports depression and describes mood as 4/10 with 10 being best   Affect:  vaguely constricted, does smile at times appropriately during session  Thought Process:  Linear and Descriptions of Associations: Intact  Orientation:  Other:  fully alert and attentive  Thought Content:  no hallucinations, no delusions, not internally preoccupied   Suicidal Thoughts:  No currently denies suicidal or self injurious ideations, denies homicidal or violent ideations  Homicidal Thoughts:  No  Memory:  recent and remote grossly intact   Judgement:  Other:  present  Insight:  Present  Psychomotor Activity:  Normal- no psychomotor restlessness   Concentration:  Concentration: Good and Attention Span: Good  Recall:  Good  Fund of Knowledge:  Good  Language:  Good  Akathisia:  Negative  Handed:  Right  AIMS (if indicated):     Assets:  Communication Skills Desire for Improvement Resilience  ADL's:  Intact  Cognition:  WNL  Sleep:  Number of Hours: 6.75      COGNITIVE FEATURES THAT CONTRIBUTE TO RISK:  No gross cognitive deficits noted upon discharge. Is alert , attentive, and oriented x 3    SUICIDE RISK:   Moderate:  Frequent suicidal ideation with limited intensity, and duration, some specificity in terms of plans, no associated intent, good self-control, limited dysphoria/symptomatology, some risk factors present, and identifiable protective factors, including available and accessible social support.  PLAN OF CARE: Patient will be admitted to inpatient psychiatric unit for stabilization and safety. Will provide and encourage milieu participation. Provide medication management and maked adjustments as needed.  Will follow daily.    I certify that inpatient services furnished can reasonably be expected to improve the patient's condition.   Craige Cotta, MD 09/30/2019, 7:25 AM

## 2019-09-30 NOTE — Progress Notes (Signed)
   09/30/19 1400  Psych Admission Type (Psych Patients Only)  Admission Status Voluntary  Psychosocial Assessment  Patient Complaints None  Eye Contact Brief  Facial Expression Flat  Affect Appropriate to circumstance  Speech Logical/coherent  Interaction Minimal  Motor Activity Other (Comment) (WDL)  Appearance/Hygiene Unremarkable  Behavior Characteristics Cooperative;Appropriate to situation  Mood Anxious;Pleasant  Aggressive Behavior  Effect No apparent injury  Thought Process  Coherency WDL  Content WDL  Delusions None reported or observed  Perception WDL  Hallucination None reported or observed  Judgment WDL  Confusion None  Danger to Self  Current suicidal ideation? Denies  Danger to Others  Danger to Others None reported or observed

## 2019-10-01 LAB — COMPREHENSIVE METABOLIC PANEL
ALT: 20 U/L (ref 0–44)
AST: 16 U/L (ref 15–41)
Albumin: 4.6 g/dL (ref 3.5–5.0)
Alkaline Phosphatase: 42 U/L (ref 38–126)
Anion gap: 9 (ref 5–15)
BUN: 9 mg/dL (ref 6–20)
CO2: 25 mmol/L (ref 22–32)
Calcium: 9.2 mg/dL (ref 8.9–10.3)
Chloride: 105 mmol/L (ref 98–111)
Creatinine, Ser: 1.14 mg/dL (ref 0.61–1.24)
GFR calc Af Amer: >60 mL/min (ref >60)
GFR calc non Af Amer: >60 mL/min (ref >60)
Glucose, Bld: 90 mg/dL (ref 70–99)
Potassium: 3.7 mmol/L (ref 3.5–5.1)
Sodium: 139 mmol/L (ref 135–145)
Total Bilirubin: 1 mg/dL (ref 0.3–1.2)
Total Protein: 7.5 g/dL (ref 6.5–8.1)

## 2019-10-01 LAB — T4, FREE: Free T4: 1.13 ng/dL — ABNORMAL HIGH (ref 0.61–1.12)

## 2019-10-01 LAB — TSH: TSH: 0.296 u[IU]/mL — ABNORMAL LOW (ref 0.350–4.500)

## 2019-10-01 LAB — CBC WITH DIFFERENTIAL/PLATELET
Abs Immature Granulocytes: 0.01 10*3/uL (ref 0.00–0.07)
Basophils Absolute: 0 10*3/uL (ref 0.0–0.1)
Basophils Relative: 1 %
Eosinophils Absolute: 0 10*3/uL (ref 0.0–0.5)
Eosinophils Relative: 1 %
HCT: 44.9 % (ref 39.0–52.0)
Hemoglobin: 14.2 g/dL (ref 13.0–17.0)
Immature Granulocytes: 0 %
Lymphocytes Relative: 32 %
Lymphs Abs: 1.1 10*3/uL (ref 0.7–4.0)
MCH: 28.6 pg (ref 26.0–34.0)
MCHC: 31.6 g/dL (ref 30.0–36.0)
MCV: 90.3 fL (ref 80.0–100.0)
Monocytes Absolute: 0.3 10*3/uL (ref 0.1–1.0)
Monocytes Relative: 10 %
Neutro Abs: 1.9 10*3/uL (ref 1.7–7.7)
Neutrophils Relative %: 56 %
Platelets: 213 10*3/uL (ref 150–400)
RBC: 4.97 MIL/uL (ref 4.22–5.81)
RDW: 11.9 % (ref 11.5–15.5)
WBC: 3.4 10*3/uL — ABNORMAL LOW (ref 4.0–10.5)
nRBC: 0 % (ref 0.0–0.2)

## 2019-10-01 MED ORDER — CAMPHOR-PHENOL 10.8-4.7 % EX LIQD: Freq: Every day | CUTANEOUS | Status: DC | PRN

## 2019-10-01 MED ORDER — ESCITALOPRAM OXALATE 10 MG PO TABS
10.0000 mg | ORAL_TABLET | Freq: Every day | ORAL | Status: DC
  Administered 2019-10-02: 10 mg via ORAL
  Filled 2019-10-01 (×4): qty 1

## 2019-10-01 NOTE — Progress Notes (Signed)
The patient rated his day as a 9 out of a  Possible 10 since he had a good day overall. His goal for tomorrow is to continue to be optimistic.

## 2019-10-01 NOTE — Progress Notes (Signed)
   10/01/19 0630  Vital Signs  Pulse Rate 74  BP 137/63  BP Location Right Arm  BP Method Automatic  Patient Position (if appropriate) Standing   D: Patient Presents with depressed mood and affect.  Patient was calm and cooperative during med pass and took his medicine without incident.  Patient was out in open areas and was social with peers and staff.  Patient denies suicidal thoughts and self harming thoughts. Patient rated anxiety 1/10 and depression 2/10 A:  Patient took scheduled medicine.  Support and encouragement provided Routine safety checks conducted every 15 minutes. Patient  Informed to notify staff with any concerns.   R:  Safety maintained.

## 2019-10-01 NOTE — Progress Notes (Signed)
Recreation Therapy Notes  Date: 7.12.21 Time: 0930 Location: [address] Dayroom  Group Topic: Stress Management  Goal Area(s) Addresses:  Patient will identify positive stress management techniques. Patient will identify benefits of using stress management post d/c.  Behavioral Response: Engaged  Intervention: Stress Management  Activity:  Meditation.  LRT played a meditation that focused on being resilient in the face of change.  Patients were to listen and follow along as meditation played to engage in activity.    Education:  Stress Management, Discharge Planning.   Education Outcome: Acknowledges Education  Clinical Observations/Feedback: Pt attended and participated in activity.    Caroll Rancher, LRT/CTRS    Caroll Rancher A 10/01/2019 10:41 AM

## 2019-10-01 NOTE — Progress Notes (Signed)
[facility] MD Progress Note  10/01/2019 12:10 PM William Ortiz  MRN:  [id number] Subjective:  Patient reports he is feeling better today. On the scale of 1-10, 10 being really depressed his mood is at 2. He denies any suicidal ideation or homicidal ideation. Denies any AVH. He reports he had poor appetite for a long time but now it has improved. He is sleeping better as well and felt refreshed this morning with the energy to get out of the bed. He states he is able to concentrate more now and is able to read his book for a longer period. He states that may be his" friends taking him out for pizza would have helped with the mood".  Objective:  Patient is seen and examined. Patient is a 24 yo M, single, no children, self employed presented to the hospital voluntarily at the request of his friends because he was experiencing suicidal ideation with a plan to jump from balcony from last three days. He is alert, oriented and cooperative. He is very pleasant and calm. He does not seems to be responding to the internal stimuli and shows no self injurious behavior on the unit. When talking about his possible diagnosis he showed full understanding. His blood pressure is slightly high (137/63) but apart from that all other vitals are stable. Labs from today are normal except Low TSH (0.296).  Principal Problem: <principal problem not specified> Diagnosis: Active Problems:   Major depressive disorder, recurrent severe without psychotic features ([place])  Total Time spent with patient: 30 minutes  Past Psychiatric History:  No prior psychiatric admissions. His father dies 6 years ago and he met with his family doctor, talked to her about feeling depressed and was prescribed Lexapro which helped him and he tolerated it well. He stopped taking it after few months and had an episode of depression as a freshman in college at which time he reports he attempted to hang self , but roommate prevented . At the time did not  seek treatment.He also states he developed anxiety, panic attacks while in college. Does not endorse agoraphobia. Does not endorse any clear history of mania or hypomania. Denies history of psychosis. Has not taken any psychiatric medications for several years .He reports he was attending individual psychotherapy up to a few months ago.    Past Medical History:  Past Medical History:  Diagnosis Date   Wolff-Parkinson-White (WPW) syndrome    History reviewed. No pertinent surgical history. Family History: History reviewed. No pertinent family history. Family Psychiatric  History: NA  Social History:  Social History   Substance and Sexual Activity  Alcohol Use Yes     Social History   Substance and Sexual Activity  Drug Use No    Social History   Socioeconomic History   Marital status: Single    Spouse name: Not on file   Number of children: Not on file   Years of education: Not on file   Highest education level: Not on file  Occupational History   Not on file  Tobacco Use   Smoking status: Never Smoker   Smokeless tobacco: Never Used  Vaping Use   Vaping Use: Never used  Substance and Sexual Activity   Alcohol use: Yes   Drug use: No   Sexual activity: Not on file  Other Topics Concern   Not on file  Social History Narrative   Not on file   Social Determinants of Health   Financial Resource Strain:    Difficulty  of Paying Living Expenses:   Food Insecurity:    Worried About Charity fundraiser in the Last Year:    Arboriculturist in the Last Year:   Transportation Needs:    Film/video editor (Medical):    Lack of Transportation (Non-Medical):   Physical Activity:    Days of Exercise per Week:    Minutes of Exercise per Session:   Stress:    Feeling of Stress :   Social Connections:    Frequency of Communication with Friends and Family:    Frequency of Social Gatherings with Friends and Family:    Attends Religious Services:     Active Member of Clubs or Organizations:    Attends Archivist Meetings:    Marital Status:    Additional Social History:    Pain Medications: See MAR Prescriptions: See MAR Over the Counter: See MAR History of alcohol / drug use?: Yes Longest period of sobriety (when/how long): Unknown Negative Consequences of Use:  (NA) Withdrawal Symptoms:  (NA) Name of Substance 1: Cannabis 1 - Age of First Use: 18 1 - Amount (size/oz): Varies 1 - Frequency: Varies 1 - Duration: Ongoing 1 - Last Use / Amount: Pt states "about a week ago" unknown amount Name of Substance 2: Alcohol 2 - Age of First Use: 18 2 - Amount (size/oz): Varies 2 - Frequency: Varies 2 - Duration: Ongoing 2 - Last Use / Amount: Pt states 'sometimes last week" pt states "a couple beers"                Sleep: Good  Appetite:  Good  Current Medications: Current Facility-Administered Medications  Medication Dose Route Frequency Provider Last Rate Last Admin   acetaminophen (TYLENOL) tablet 650 mg  650 mg Oral Q6H PRN Patrecia Pour, NP       alum & mag hydroxide-simeth (MAALOX/MYLANTA) 200-200-20 MG/5ML suspension 30 mL  30 mL Oral Q4H PRN Patrecia Pour, NP       escitalopram (LEXAPRO) tablet 5 mg  5 mg Oral Daily Cobos, Myer Peer, MD   5 mg at 10/01/19 0751   feeding supplement (ENSURE ENLIVE) (ENSURE ENLIVE) liquid 237 mL  237 mL Oral BID BM Cobos, Myer Peer, MD       LORazepam (ATIVAN) tablet 0.5 mg  0.5 mg Oral Q6H PRN Cobos, Myer Peer, MD       magnesium hydroxide (MILK OF MAGNESIA) suspension 30 mL  30 mL Oral Daily PRN Patrecia Pour, NP       traZODone (DESYREL) tablet 50 mg  50 mg Oral QHS PRN Armando Reichert, MD        Lab Results:  Results for orders placed or performed during the hospital encounter of 09/29/19 (from the past 48 hour(s))  SARS Coronavirus 2 by RT PCR (hospital order, performed in [facility] hospital lab) Nasopharyngeal Nasopharyngeal Swab     Status:  None   Collection Time: 09/29/19  2:59 PM   Specimen: Nasopharyngeal Swab  Result Value Ref Range   SARS Coronavirus 2 NEGATIVE NEGATIVE    Comment: (NOTE) SARS-CoV-2 target nucleic acids are NOT DETECTED.  The SARS-CoV-2 RNA is generally detectable in upper and lower respiratory specimens during the acute phase of infection. The lowest concentration of SARS-CoV-2 viral copies this assay can detect is 250 copies / mL. A negative result does not preclude SARS-CoV-2 infection and should not be used as the sole basis for treatment or other patient management decisions.  A negative result may occur with improper specimen collection / handling, submission of specimen other than nasopharyngeal swab, presence of viral mutation(s) within the areas targeted by this assay, and inadequate number of viral copies (<250 copies / mL). A negative result must be combined with clinical observations, patient history, and epidemiological information.  Fact Sheet for Patients:   StrictlyIdeas.no  Fact Sheet for Healthcare Providers: BankingDealers.co.za  This test is not yet approved or  cleared by the [place] FDA and has been authorized for detection and/or diagnosis of SARS-CoV-2 by FDA under an Emergency Use Authorization (EUA).  This EUA will remain in effect (meaning this test can be used) for the duration of the COVID-19 declaration under Section 564(b)(1) of the Act, 21 U.S.C. section 360bbb-3(b)(1), unless the authorization is terminated or revoked sooner.  Performed at [facility], [place] [address] [place], [place] [address]   Rapid urine drug screen (hospital performed)     Status: None   Collection Time: 09/30/19 12:50 PM  Result Value Ref Range   Opiates NONE DETECTED NONE DETECTED   Cocaine NONE DETECTED NONE DETECTED   Benzodiazepines NONE DETECTED NONE DETECTED   Amphetamines NONE DETECTED NONE DETECTED    Tetrahydrocannabinol NONE DETECTED NONE DETECTED   Barbiturates NONE DETECTED NONE DETECTED    Comment: (NOTE) DRUG SCREEN FOR MEDICAL PURPOSES ONLY.  IF CONFIRMATION IS NEEDED FOR ANY PURPOSE, NOTIFY LAB WITHIN 5 DAYS.  LOWEST DETECTABLE LIMITS FOR URINE DRUG SCREEN Drug Class                     Cutoff (ng/mL) Amphetamine and metabolites    1000 Barbiturate and metabolites    200 Benzodiazepine                 097 Tricyclics and metabolites     300 Opiates and metabolites        300 Cocaine and metabolites        300 THC                            50 Performed at [facility], [place] [address], [place], [place] [address]   Comprehensive metabolic panel     Status: None   Collection Time: 10/01/19  6:37 AM  Result Value Ref Range   Sodium 139 135 - 145 mmol/L   Potassium 3.7 3.5 - 5.1 mmol/L   Chloride 105 98 - 111 mmol/L   CO2 25 22 - 32 mmol/L   Glucose, Bld 90 70 - 99 mg/dL    Comment: Glucose reference range applies only to samples taken after fasting for at least 8 hours.   BUN 9 6 - 20 mg/dL   Creatinine, Ser 1.14 0.61 - 1.24 mg/dL   Calcium 9.2 8.9 - 10.3 mg/dL   Total Protein 7.5 6.5 - 8.1 g/dL   Albumin 4.6 3.5 - 5.0 g/dL   AST 16 15 - 41 U/L   ALT 20 0 - 44 U/L   Alkaline Phosphatase 42 38 - 126 U/L   Total Bilirubin 1.0 0.3 - 1.2 mg/dL   GFR calc non Af Amer >60 >60 mL/min   GFR calc Af Amer >60 >60 mL/min   Anion gap 9 5 - 15    Comment: Performed at [facility], [place] [address] [place], [place] [address]  CBC with Differential/Platelet     Status: Abnormal   Collection Time:  10/01/19  6:37 AM  Result Value Ref Range   WBC 3.4 (L) 4.0 - 10.5 K/uL   RBC 4.97 4.22 - 5.81 MIL/uL   Hemoglobin 14.2 13.0 - 17.0 g/dL   HCT 44.9 39 - 52 %   MCV 90.3 80.0 - 100.0 fL   MCH 28.6 26.0 - 34.0 pg   MCHC 31.6 30.0 - 36.0 g/dL   RDW 11.9 11.5 - 15.5 %   Platelets 213 150 - 400 K/uL   nRBC 0.0 0.0 - 0.2 %   Neutrophils Relative  % 56 %   Neutro Abs 1.9 1.7 - 7.7 K/uL   Lymphocytes Relative 32 %   Lymphs Abs 1.1 0.7 - 4.0 K/uL   Monocytes Relative 10 %   Monocytes Absolute 0.3 0 - 1 K/uL   Eosinophils Relative 1 %   Eosinophils Absolute 0.0 0 - 0 K/uL   Basophils Relative 1 %   Basophils Absolute 0.0 0 - 0 K/uL   Immature Granulocytes 0 %   Abs Immature Granulocytes 0.01 0.00 - 0.07 K/uL    Comment: Performed at [facility], [place] [address], [place], [place] [address]  TSH     Status: Abnormal   Collection Time: 10/01/19  6:37 AM  Result Value Ref Range   TSH 0.296 (L) 0.350 - 4.500 uIU/mL    Comment: Performed by a 3rd Generation assay with a functional sensitivity of <=0.01 uIU/mL. Performed at [facility], [place] [address] [place], [place] [address]     Blood Alcohol level:  No results found for: [facility]  Metabolic Disorder Labs: No results found for: HGBA1C, MPG No results found for: PROLACTIN No results found for: CHOL, TRIG, HDL, CHOLHDL, VLDL, LDLCALC  Physical Findings: AIMS: Facial and Oral Movements Muscles of Facial Expression: None, normal Lips and Perioral Area: None, normal Jaw: None, normal Tongue: None, normal,Extremity Movements Upper (arms, wrists, hands, fingers): None, normal, Trunk Movements Neck, shoulders, hips: None, normal, Overall Severity Severity of abnormal movements (highest score from questions above): None, normal Incapacitation due to abnormal movements: None, normal Patient's awareness of abnormal movements (rate only patient's report): No Awareness, Dental Status Current problems with teeth and/or dentures?: No Does patient usually wear dentures?: No  CIWA:    COWS:     Musculoskeletal: Strength & Muscle Tone: within normal limits Gait & Station: normal Patient leans: N/A  Psychiatric Specialty Exam: Physical Exam  Review of Systems  Blood pressure 137/63, pulse 74, temperature 97.8 F (36.6 C), temperature source Oral,  resp. rate 18, height 6' (1.829 m), weight 111.1 kg.Body mass index is 33.23 kg/m.  General Appearance: Neat  Eye Contact:  Good  Speech:  Clear and Coherent  Volume:  Normal  Mood:  Good  Affect:  Appropriate  Thought Process:  Coherent  Orientation:  Full (Time, Place, and Person)  Thought Content:  WDL  Suicidal Thoughts:  No  Homicidal Thoughts:  No  Memory:  Immediate;   Good Recent;   Good  Judgement:  Good  Insight:  Good  Psychomotor Activity:  Normal  Concentration:  Concentration: Good and Attention Span: Good  Recall:  Good  Fund of Knowledge:  Good  Language:  Good  Akathisia:  No  Handed:  Right  AIMS (if indicated):     Assets:  Communication Skills Desire for Improvement Resilience Social Support  ADL's:  Intact  Cognition:  WNL  Sleep:  Number of Hours: 6.75   Assessment: Patient is a 24  yo M, single, no children, self employed. He presented to the hospital voluntarily at the request of his friends because he was experiencing suicidal ideation with a plan to jump from balcony from last three days.  D/D:  1. Major Depressive Disorder: Reported  feeling depressed, socially isolating , spending " a lot of time just lying in bed, not going outside even". Reported low energy, unable to sleep, poor appetite, feeling unmotivated and isolating himself from last 2 weeks. 2.Panic Disorder: He reports he has been experiencing increased anxiety.He describes he has been experiencing panic attacks and increased tendency to worry which has made daily functioning and focusing on tasks more difficult. He states " the anxiety makes me fall behind on work and not doing things that I said I was going to do and that in turn makes me even more anxious.  Treatment Plan Summary: Daily contact with patient to assess and evaluate symptoms and progress in treatment.  Plan:  1.Continue Lexapro 61m Po daily for mood disorder 2. Lorazepam 0.5 mg Q6H PRN for anxiety 3. Trazodone 50 mg  QHS PRN 4. Order free T3 and T4. 5. Disposition in progress.  AHonor Junes MD 10/01/2019, 12:10 PM

## 2019-10-01 NOTE — Tx Team (Cosign Needed)
Interdisciplinary Treatment and Diagnostic Plan Update  10/01/2019 Time of Session: 9:15am William Ortiz MRN: [id number]  Principal Diagnosis: <principal problem not specified>  Secondary Diagnoses: Active Problems:   Major depressive disorder, recurrent severe without psychotic features ([facility])   Current Medications:  Current Facility-Administered Medications  Medication Dose Route Frequency Provider Last Rate Last Admin  . acetaminophen (TYLENOL) tablet 650 mg  650 mg Oral Q6H PRN Charm Rings, NP      . alum & mag hydroxide-simeth (MAALOX/MYLANTA) 200-200-20 MG/5ML suspension 30 mL  30 mL Oral Q4H PRN Charm Rings, NP      . escitalopram (LEXAPRO) tablet 5 mg  5 mg Oral Daily Cobos, Rockey Situ, MD   5 mg at 10/01/19 0751  . feeding supplement (ENSURE ENLIVE) (ENSURE ENLIVE) liquid 237 mL  237 mL Oral BID BM Cobos, Fernando A, MD      . LORazepam (ATIVAN) tablet 0.5 mg  0.5 mg Oral Q6H PRN Cobos, Fernando A, MD      . magnesium hydroxide (MILK OF MAGNESIA) suspension 30 mL  30 mL Oral Daily PRN Charm Rings, NP      . traZODone (DESYREL) tablet 50 mg  50 mg Oral QHS PRN Karsten Ro, MD       PTA Medications: Medications Prior to Admission  Medication Sig Dispense Refill Last Dose  . Biotin 10 MG TABS Take 1 tablet by mouth every Monday, Wednesday, and Friday.     Marland Kitchen omega-3 acid ethyl esters (LOVAZA) 1 g capsule Take 1 g by mouth daily.     Marland Kitchen escitalopram (LEXAPRO) 10 MG tablet Take 10 mg by mouth daily as needed. (Patient not taking: Reported on 09/30/2019)   Not Taking at Unknown time  . ibuprofen (ADVIL,MOTRIN) 600 MG tablet Take 1 tablet (600 mg total) by mouth every 6 (six) hours as needed. 30 tablet 0   . Polyethyl Glycol-Propyl Glycol (SYSTANE OP) Apply 2 drops to eye as needed (dry eyes). (Patient not taking: Reported on 09/30/2019)   Not Taking at Unknown time    Patient Stressors: Educational concerns Health problems Occupational concerns Traumatic  event  Patient Strengths: Ability for insight Capable of independent living  Treatment Modalities: Medication Management, Group therapy, Case management,  1 to 1 session with clinician, Psychoeducation, Recreational therapy.   Physician Treatment Plan for Primary Diagnosis: <principal problem not specified> Long Term Goal(s): Improvement in symptoms so as ready for discharge Improvement in symptoms so as ready for discharge   Short Term Goals: Ability to maintain clinical measurements within normal limits will improve Ability to identify changes in lifestyle to reduce recurrence of condition will improve Ability to verbalize feelings will improve Ability to disclose and discuss suicidal ideas Compliance with prescribed medications will improve  Medication Management: Evaluate patient's response, side effects, and tolerance of medication regimen.  Therapeutic Interventions: 1 to 1 sessions, Unit Group sessions and Medication administration.  Evaluation of Outcomes: Progressing  Physician Treatment Plan for Secondary Diagnosis: Active Problems:   Major depressive disorder, recurrent severe without psychotic features (HCC)  Long Term Goal(s): Improvement in symptoms so as ready for discharge Improvement in symptoms so as ready for discharge   Short Term Goals: Ability to maintain clinical measurements within normal limits will improve Ability to identify changes in lifestyle to reduce recurrence of condition will improve Ability to verbalize feelings will improve Ability to disclose and discuss suicidal ideas Compliance with prescribed medications will improve     Medication Management: Evaluate patient's response,  side effects, and tolerance of medication regimen.  Therapeutic Interventions: 1 to 1 sessions, Unit Group sessions and Medication administration.  Evaluation of Outcomes: Progressing   RN Treatment Plan for Primary Diagnosis: <principal problem not  specified> Long Term Goal(s): Knowledge of disease and therapeutic regimen to maintain health will improve  Short Term Goals: Ability to remain free from injury will improve, Ability to disclose and discuss suicidal ideas, Ability to identify and develop effective coping behaviors will improve and Compliance with prescribed medications will improve  Medication Management: RN will administer medications as ordered by provider, will assess and evaluate patient's response and provide education to patient for prescribed medication. RN will report any adverse and/or side effects to prescribing provider.  Therapeutic Interventions: 1 on 1 counseling sessions, Psychoeducation, Medication administration, Evaluate responses to treatment, Monitor vital signs and CBGs as ordered, Perform/monitor CIWA, COWS, AIMS and Fall Risk screenings as ordered, Perform wound care treatments as ordered.  Evaluation of Outcomes: Progressing   LCSW Treatment Plan for Primary Diagnosis: <principal problem not specified> Long Term Goal(s): Safe transition to appropriate next level of care at discharge, Engage patient in therapeutic group addressing interpersonal concerns.  Short Term Goals: Engage patient in aftercare planning with referrals and resources, Increase social support, Identify triggers associated with mental health/substance abuse issues and Increase skills for wellness and recovery  Therapeutic Interventions: Assess for all discharge needs, 1 to 1 time with Social worker, Explore available resources and support systems, Assess for adequacy in community support network, Educate family and significant other(s) on suicide prevention, Complete Psychosocial Assessment, Interpersonal group therapy.  Evaluation of Outcomes: Progressing   Progress in Treatment: Attending groups: Yes. Participating in groups: Yes. Taking medication as prescribed: No. Toleration medication: Yes. Family/Significant other contact  made: No, will contact:  patient declined consents. Patient understands diagnosis: Yes. Discussing patient identified problems/goals with staff: Yes. Medical problems stabilized or resolved: Yes. Denies suicidal/homicidal ideation: Yes. Issues/concerns per patient self-inventory: No. Other: none.  New problem(s) identified: No, Describe:  CSW will assess.  New Short Term/Long Term Goal(s):  Patient Goals:  Pt did not attend.   Discharge Plan or Barriers:   Reason for Continuation of Hospitalization: Depression Medication stabilization Suicidal ideation  Estimated Length of Stay: 3-5 days   Attendees: Patient: 10/01/2019   Physician:  10/01/2019   Nursing:  10/01/2019  RN Care Manager: 10/01/2019   Social Worker: Ruthann Cancer, LCSWA 10/01/2019   Recreational Therapist:  10/01/2019   Other:  10/01/2019   Other:  10/01/2019   Other: 10/01/2019     Scribe for Treatment Team: Otelia Santee, LCSWA 10/01/2019 10:20 AM

## 2019-10-01 NOTE — B[facility] Group Notes (Signed)
Occupational Therapy Group Note Date: 10/01/2019 Group Topic/Focus: Stress Management  Group Description: Patients engaged collaboratively in discussion focused on stress management, specifically identifying physical signs, emotional signs, negative strategies, and positive strategies in which we manage our stress. Patients shared personal examples and situations in which they have endured stress and how they have managed in the past. Patients engaged collaboratively by having ideas written out in a chart on the white board and were provided further education/resources via a stress management handout.  Participation Level: Active   Participation Quality: Independent   Behavior: Alert, Appropriate, Calm and Cooperative   Speech/Thought Process: Directed, Distracted and Focused   Affect/Mood: Euthymic   Insight: Moderate   Judgement: Moderate   Individualization: Dhanvin was active and engaged, however intermittently distracted and joking with peers. He was able to be redirected with minimal cues and offered several relevant contributions to discussion. Identified nature and meditation as strategies he utilizes to manage his stress. He also shared using his photography hobby/career.  Modes of Intervention: Discussion, Education and Socialization  Patient Response to Interventions:  Attentive, Engaged, Receptive and Interested   Plan: Continue to engage patient in OT groups 2 - 3x/week.  Donne Hazel, MOT, OTR/L

## 2019-10-01 NOTE — Progress Notes (Signed)
Nutrition Brief Note RD working remotely.   Patient identified on the Malnutrition Screening Tool (MST) Report  Wt Readings from Last 15 Encounters:  09/29/19 111.1 kg  03/03/17 108.9 kg    Body mass index is 33.23 kg/m. Patient meets criteria for obesity based on current BMI.   Current diet order is Heart Healthy and patient is currently eating as desired for meals and snacks.  Labs and medications reviewed.   No nutrition interventions warranted at this time. If nutrition issues arise, please consult RD.      Trenton Gammon, MS, RD, LDN, CNSC Inpatient Clinical Dietitian RD pager # available in [facility]  After hours/weekend pager # available in [facility]

## 2019-10-02 LAB — T3, FREE: T3, Free: 3.1 pg/mL (ref 2.0–4.4)

## 2019-10-02 MED ORDER — ESCITALOPRAM OXALATE 10 MG PO TABS: 10.0000 mg | ORAL_TABLET | Freq: Every day | ORAL | 0 refills | Status: AC

## 2019-10-02 MED ORDER — ACYCLOVIR 5 % EX OINT: TOPICAL_OINTMENT | CUTANEOUS | 0 refills | Status: AC

## 2019-10-02 MED ORDER — ACYCLOVIR 5 % EX OINT
TOPICAL_OINTMENT | CUTANEOUS | Status: DC
  Administered 2019-10-02: 1 via TOPICAL
  Filled 2019-10-02: qty 15

## 2019-10-02 NOTE — [facility] Suicide Risk Assessment (Signed)
[facility] Discharge Suicide Risk Assessment   Principal Problem: <principal problem not specified> Discharge Diagnoses: Active Problems:   Major depressive disorder, recurrent severe without psychotic features (HCC)   Total Time spent with patient: 20 minutes  Musculoskeletal: Strength & Muscle Tone: within normal limits Gait & Station: normal Patient leans: N/A  Psychiatric Specialty Exam: Review of Systems  Skin: Positive for rash.  All other systems reviewed and are negative.   Blood pressure (!) 121/57, pulse 75, temperature (!) 97.5 F (36.4 C), temperature source Oral, resp. rate 18, height 6' (1.829 m), weight 111.1 kg.Body mass index is 33.23 kg/m.  General Appearance: Casual  Eye Contact::  Good  Speech:  Normal Rate409  Volume:  Normal  Mood:  Euthymic  Affect:  Congruent  Thought Process:  Coherent and Descriptions of Associations: Intact  Orientation:  Full (Time, Place, and Person)  Thought Content:  Logical  Suicidal Thoughts:  No  Homicidal Thoughts:  No  Memory:  Immediate;   Good Recent;   Good Remote;   Good  Judgement:  Intact  Insight:  Fair  Psychomotor Activity:  Normal  Concentration:  Fair  Recall:  Fair  Fund of Knowledge:Good  Language: Good  Akathisia:  Negative  Handed:  Right  AIMS (if indicated):     Assets:  Communication Skills Desire for Improvement Housing Resilience Social Support Talents/Skills  Sleep:  Number of Hours: 6.5  Cognition: WNL  ADL's:  Intact   Mental Status Per Nursing Assessment::   On Admission:  Suicidal ideation indicated by patient  Demographic Factors:  Male  Loss Factors: NA  Historical Factors: Impulsivity  Risk Reduction Factors:   Living with another person, especially a relative and Positive social support  Continued Clinical Symptoms:  Severe Anxiety and/or Agitation Depression:   Impulsivity  Cognitive Features That Contribute To Risk:  None    Suicide Risk:  Minimal: No  identifiable suicidal ideation.  Patients presenting with no risk factors but with morbid ruminations; may be classified as minimal risk based on the severity of the depressive symptoms   Follow-up Information    [facility] Call.   Why: You will need to fill out new patient paperwork in order to re-establish services and make an appointment with your former therapist. Contact information: [address] [place] [address] [phone]        [facility] Follow up on 10/17/2019.   Why: You have a virtual appointment on 10/17/19 at 2:00 pm for therapy.  You also have an appointment for medication management on 10/31/19 at 3:30 pm.  Both of these appointments will be held Virtually. Contact information: [address] [place] [address] [phone]               Plan Of Care/Follow-up recommendations:  Activity:  ad lib  Antonieta Pert, MD 10/02/2019, 9:44 AM

## 2019-10-02 NOTE — Progress Notes (Signed)
  [facility] Adult Case Management Discharge Plan :  Will you be returning to the same living situation after discharge:  Yes,  to home.  At discharge, do you have transportation home?: Yes,  a friend is to pick patient up.  Do you have the ability to pay for your medications: Yes,  has insurance.  Release of information consent forms completed and in the chart;  Patient's signature needed at discharge.  Patient to Follow up at:  Follow-up Information    [facility] Call.   Why: You will need to fill out new patient paperwork in order to re-establish services and make an appointment with your former therapist. Contact information: [address] [place] [address] [phone]        [facility] Follow up on 10/17/2019.   Why: You have a virtual appointment on 10/17/19 at 2:00 pm for therapy.  You also have an appointment for medication management on 10/31/19 at 3:30 pm.  Both of these appointments will be held Virtually. Contact information: [address] [place] [address] [phone]               Next level of care provider has access to [facility] Link:no  Safety Planning and Suicide Prevention discussed: Yes,  with patient.   Have you used any form of tobacco in the last 30 days? (Cigarettes, Smokeless Tobacco, Cigars, and/or Pipes): No  Has patient been referred to the Quitline?: N/A patient is not a smoker  Patient has been referred for addiction treatment: N/A  Otelia Santee, LCSWA 10/02/2019, 11:31 AM

## 2019-10-02 NOTE — Progress Notes (Signed)
Pt c/o a cold sore on his upper lip which is currently not oozing any drainage. Pt reports a hx of cold sores from stress and when he travels a lot. He said that he sometimes applies an ointment or takes a pill for treatment, but cannot recall the specific names. NP, Nira Conn notified and an order for camphor-phenol was entered. Pharmacist called and notified this writer that the medication is not in any of our pyxis machines at [facility] or at there pharmacy. The pharmacist then made the recommendation for abreva-Docosanol, which is also unavailable. NP, Nira Conn updated. No new orders at this time.

## 2019-10-02 NOTE — Progress Notes (Signed)
Pt said that he's been very "optimistic". Pt has been seen in the dayroom interacting with others and attending group. Pt said he was able to learn stress relieving activities from today's groups. He shared the events that led to his SI. He said that he had isolated himself for 7 days and when he had shared his suicidal thoughts with his friends, they "tricked" him and brought him to [facility] for evaluation. Pt said that his stressor has been being "overwhelmed with work" which caused him increasing anxiety. He reports having relapses in chronic depression which typically last 3 days, but this time it lasted longer. His plan is to see a therapist on an outpatient basis now. Active listening, reassurance, and support provided. Pt denies SI/HI and AVH. Q 15 min safety checks continue. Pt's safety has been maintained.    10/01/19 2045  Psych Admission Type (Psych Patients Only)  Admission Status Voluntary  Psychosocial Assessment  Patient Complaints None  Eye Contact Fair  Facial Expression Other (Comment) (appropriate)  Affect Appropriate to circumstance  Speech Logical/coherent  Interaction Assertive  Motor Activity Other (Comment) (WDL)  Appearance/Hygiene Unremarkable  Behavior Characteristics Cooperative;Appropriate to situation;Calm  Mood Pleasant  Thought Process  Coherency WDL  Content WDL  Delusions None reported or observed  Perception WDL  Hallucination None reported or observed  Judgment WDL  Confusion None  Danger to Self  Current suicidal ideation? Denies  Danger to Others  Danger to Others None reported or observed

## 2019-10-02 NOTE — Discharge Summary (Signed)
Physician Discharge Summary Note  Patient:  William Ortiz is an 24 y.o., male MRN:  [id number] DOB:  [birth year] Patient phone:  [phone] (home)  Patient address:   [address] [place] [address],  Total Time spent with patient: 30 minutes  Date of Admission:  09/29/2019 Date of Discharge: 10/02/19  Reason for Admission:  Patient is a 24 yo M, single, no children, self employed. He presented voluntarily to the hospital at the request of his friends because he was experiencing suicidal ideation with a plan to jump from balcony from last three days. He reported feeling depressed, low energy, unable to sleep, poor appetite, feeling unmotivated and isolating himself from last 2 weeks.Also,reporteded experiencing increased panic attacks and increased tendency to worry which has made daily functioning and focusing on tasks more difficult. He was admitted to inpatient unit for further evaluation and stabilization.  Principal Problem: <principal problem not specified> Discharge Diagnoses: Active Problems:   Major depressive disorder, recurrent severe without psychotic features [facility])   Past Psychiatric History: No prior psychiatric admissions. His father died 6 years ago and he met with his family doctor, talked to her about feeling depressed and was prescribed Lexapro which helped him and he tolerated it well. He stopped taking it after few months and had an episode of depression as a freshman in college at which time he reports he attempted to hang self , but roommate prevented . At the time did not seek treatment.He also states he developed anxiety, panic attacks while in college. Does not endorse agoraphobia. Does not endorse any clear history of mania or hypomania. Denies history of psychosis. Has not taken any psychiatric medications for several years .He reports he was attending individual psychotherapy up to a few months ago.   Past Medical History:  Past Medical  History:  Diagnosis Date  . Wolff-Parkinson-White (WPW) syndrome    History reviewed. No pertinent surgical history. Family History: History reviewed. No pertinent family history. Family Psychiatric  History: NA Social History:  Social History   Substance and Sexual Activity  Alcohol Use Yes     Social History   Substance and Sexual Activity  Drug Use No    Social History   Socioeconomic History  . Marital status: Single    Spouse name: Not on file  . Number of children: Not on file  . Years of education: Not on file  . Highest education level: Not on file  Occupational History  . Not on file  Tobacco Use  . Smoking status: Never Smoker  . Smokeless tobacco: Never Used  Vaping Use  . Vaping Use: Never used  Substance and Sexual Activity  . Alcohol use: Yes  . Drug use: No  . Sexual activity: Not on file  Other Topics Concern  . Not on file  Social History Narrative  . Not on file   Social Determinants of Health   Financial Resource Strain:   . Difficulty of Paying Living Expenses:   Food Insecurity:   . Worried About Charity fundraiser in the Last Year:   . Arboriculturist in the Last Year:   Transportation Needs:   . Film/video editor (Medical):   Marland Kitchen Lack of Transportation (Non-Medical):   Physical Activity:   . Days of Exercise per Week:   . Minutes of Exercise per Session:   Stress:   . Feeling of Stress :   Social Connections:   . Frequency of Communication with Friends and  Family:   . Frequency of Social Gatherings with Friends and Family:   . Attends Religious Services:   . Active Member of Clubs or Organizations:   . Attends Archivist Meetings:   Marland Kitchen Marital Status:     Hospital Course:  Patient reported experiencing increased anxiety and depression. Also, added about frequent panic attacks. After reviewing options he reported past history of good response to Lexapro.  Lexapro at 5 mgrs QDAY was started. Also, Ativan 0.5 mgrs Q 6  hours PRN for anxiety as needed along with Trazodone 50 mgrs QHS PRN insomnia. Routine labs ( CBC, CMP, UDS, TSH) were ordered. Patient reported feeling better and presenting with improving affect. Denies SI at this time. Tolerating Lexapro well thus far, Increased Lexapro to 10 mgrs QDAY.  Patient reported feeling optimistic and discharged on same 10 mg Lexapro PO daily.       Physical Findings: AIMS: Facial and Oral Movements Muscles of Facial Expression: None, normal Lips and Perioral Area: None, normal Jaw: None, normal Tongue: None, normal,Extremity Movements Upper (arms, wrists, hands, fingers): None, normal Lower (legs, knees, ankles, toes): None, normal, Trunk Movements Neck, shoulders, hips: None, normal, Overall Severity Severity of abnormal movements (highest score from questions above): None, normal Incapacitation due to abnormal movements: None, normal Patient's awareness of abnormal movements (rate only patient's report): No Awareness, Dental Status Current problems with teeth and/or dentures?: No Does patient usually wear dentures?: No  CIWA:    COWS:     Musculoskeletal: Strength & Muscle Tone: within normal limits Gait & Station: normal Patient leans: N/A  Psychiatric Specialty Exam: Physical Exam Constitutional:      Appearance: Normal appearance.  HENT:     Head: Normocephalic and atraumatic.     Nose: Nose normal.     Mouth/Throat:     Pharynx: No posterior oropharyngeal erythema.     Comments: Cold sore  Musculoskeletal:        General: Normal range of motion.     Cervical back: Normal range of motion.  Neurological:     General: No focal deficit present.     Mental Status: He is alert and oriented to person, place, and time.  Psychiatric:        Mood and Affect: Mood normal.        Behavior: Behavior normal.        Thought Content: Thought content normal.        Judgment: Judgment normal.     Review of Systems  Blood pressure (!) 121/57,  pulse 75, temperature (!) 97.5 F (36.4 C), temperature source Oral, resp. rate 18, height 6' (1.829 m), weight 111.1 kg.Body mass index is 33.23 kg/m.  General Appearance: Neat  Eye Contact:  Good  Speech:  Clear and Coherent  Volume:  Normal  Mood:  Good  Affect:  Appropriate  Thought Process:  Coherent  Orientation:  Full (Time, Place, and Person)  Thought Content:  WDL  Suicidal Thoughts:  No  Homicidal Thoughts:  No  Memory:  Immediate;   Good Recent;   Good  Judgement:  Good  Insight:  Good  Psychomotor Activity:  Normal  Concentration:  Concentration: Good and Attention Span: Good  Recall:  Good  Fund of Knowledge:  Good  Language:  Good  Akathisia:  No  Handed:  Right  AIMS (if indicated):     Assets:  Communication Skills Desire for Improvement Housing Resilience Social Support  ADL's:  Intact  Cognition:  WNL  Sleep:  Number of Hours: 6.5     Have you used any form of tobacco in the last 30 days? (Cigarettes, Smokeless Tobacco, Cigars, and/or Pipes): No  Has this patient used any form of tobacco in the last 30 days? (Cigarettes, Smokeless Tobacco, Cigars, and/or Pipes) Yes, N/A  Blood Alcohol level:  No results found for: [facility]  Metabolic Disorder Labs:  No results found for: HGBA1C, MPG No results found for: PROLACTIN No results found for: CHOL, TRIG, HDL, CHOLHDL, VLDL, LDLCALC  See Psychiatric Specialty Exam and Suicide Risk Assessment completed by Attending Physician prior to discharge.  Discharge destination:  Home  Is patient on multiple antipsychotic therapies at discharge:  No   Has Patient had three or more failed trials of antipsychotic monotherapy by history:  No  Recommended Plan for Multiple Antipsychotic Therapies: NA  Discharge Instructions    Discharge instructions   Complete by: As directed    Patient is instructed to take all prescribed medications as recommended. Report any side effects or adverse reactions to your outpatient  psychiatrist. Patient is instructed to abstain from alcohol and illegal drugs while on prescription medications. In the event of worsening symptoms, patient is instructed to call the crisis hotline, 911, or go to the nearest emergency department for evaluation and treatment.   Discharge instructions   Complete by: As directed    *Follow up with primary care provider for thyroid- TSH 0.296 and T4 1.13 on 10/01/19.* Activity as tolerated. Diet as recommended by primary care physician. Keep all scheduled follow-up appointments as recommended.     Allergies as of 10/02/2019   No Known Allergies     Medication List    STOP taking these medications   Biotin 10 MG Tabs   ibuprofen 600 MG tablet Commonly known as: ADVIL   omega-3 acid ethyl esters 1 g capsule Commonly known as: LOVAZA   SYSTANE OP     TAKE these medications     Indication  acyclovir ointment 5 % Commonly known as: ZOVIRAX Apply topically every 3 (three) hours.  Indication: Herpes Simplex Affecting the Lip   escitalopram 10 MG tablet Commonly known as: LEXAPRO Take 1 tablet (10 mg total) by mouth daily. Start taking on: October 03, 2019 What changed:   when to take this  reasons to take this  Indication: Major Depressive Disorder       Follow-up Information    [facility] Call.   Why: You will need to fill out new patient paperwork in order to re-establish services and make an appointment with your former therapist. Contact information: [place] [address] [place] Follow up on 10/17/2019.   Why: You have a virtual appointment on 10/17/19 at 2:00 pm for therapy.  You also have an appointment for medication management on 10/31/19 at 3:30 pm.  Both of these appointments will be held Virtually. Contact information: [address] [place] [address] [phone]               Follow-up recommendations:  Activity:   Normal Diet:  Normal  Comments:  Patient exhibits good mood and sounds optimistic. He is excited about getting discharged and planning to go to get pizza before going home. His TSH (0.296) is low and T4 little high (1.13)- he is informed about these results and he is going to follow up with his PCP. He slept well, has good energy, good  insight about his diagnosis and plans to adhere to his medications.  1. Continue Escitalopram ( Lexapro) 10 mg PO daily. 2. Contact PCP about High T4, Low TSH.  Signed: Honor Junes, MD 10/02/2019, 10:45 AM

## 2019-10-02 NOTE — Progress Notes (Signed)
RN met with pt and reviewed pt's discharge instructions.  Pt verbalized understanding of discharge instructions and pt did not have any questions. RN reviewed and provided pt with a copy of SRA, AVS and Transition Record.  RN returned pt's belongings to pt.  Pt denied SI/HI/AVH and voiced no concerns.  Pt was appreciative of the care pt received at [facility].  Patient discharged to the lobby without incident. 

## 2019-10-02 NOTE — Progress Notes (Signed)
   10/01/19 2045  COVID-19 Daily Checkoff  Have you had a fever (temp > 37.80C/100F)  in the past 24 hours?  No  COVID-19 EXPOSURE  Have you traveled outside the state in the past 14 days? No  Have you been in contact with someone with a confirmed diagnosis of COVID-19 or PUI in the past 14 days without wearing appropriate PPE? No  Have you been living in the same home as a person with confirmed diagnosis of COVID-19 or a PUI (household contact)? No  Have you been diagnosed with COVID-19? No

## 2020-01-07 ENCOUNTER — Emergency Department (HOSPITAL_COMMUNITY)
Admission: EM | Admit: 2020-01-07 | Discharge: 2020-01-07 | Discharge status: Against Medical Advice | Payer: Managed Care, Other (non HMO)

## 2020-01-07 ENCOUNTER — Other Ambulatory Visit: Payer: Self-pay
